# Patient Record
Sex: Male | Born: 1980 | Race: Black or African American | Hispanic: No | Marital: Married | Smoking: Current every day smoker
Health system: Southern US, Community
[De-identification: ages and names within clinical notes are randomized; demographics above are authoritative.]

---

## 2010-06-17 ENCOUNTER — Emergency Department (HOSPITAL_COMMUNITY): Admission: EM | Admit: 2010-06-17 | Discharge: 2010-06-17 | Payer: Self-pay | Admitting: Emergency Medicine

## 2010-07-13 ENCOUNTER — Emergency Department (HOSPITAL_COMMUNITY): Admission: EM | Admit: 2010-07-13 | Discharge: 2010-07-13 | Payer: Self-pay | Admitting: Emergency Medicine

## 2010-07-23 ENCOUNTER — Emergency Department (HOSPITAL_COMMUNITY): Admission: EM | Admit: 2010-07-23 | Discharge: 2010-07-23 | Payer: Self-pay | Admitting: Emergency Medicine

## 2010-10-10 ENCOUNTER — Emergency Department (HOSPITAL_COMMUNITY): Admission: EM | Admit: 2010-10-10 | Discharge: 2010-03-10 | Payer: Self-pay | Admitting: Emergency Medicine

## 2011-01-17 LAB — URINALYSIS, ROUTINE W REFLEX MICROSCOPIC
Glucose, UA: NEGATIVE mg/dL
Ketones, ur: 15 mg/dL — AB
Nitrite: NEGATIVE
Protein, ur: NEGATIVE mg/dL
Specific Gravity, Urine: 1.026 (ref 1.005–1.030)

## 2011-01-17 LAB — URINE MICROSCOPIC-ADD ON

## 2011-05-12 ENCOUNTER — Inpatient Hospital Stay (INDEPENDENT_AMBULATORY_CARE_PROVIDER_SITE_OTHER)
Admission: RE | Admit: 2011-05-12 | Discharge: 2011-05-12 | Disposition: A | Discharge status: Home / Self Care | Payer: Self-pay | Source: Ambulatory Visit | Attending: Family Medicine | Admitting: Family Medicine

## 2011-05-12 DIAGNOSIS — L259 Unspecified contact dermatitis, unspecified cause: Secondary | ICD-10-CM

## 2012-05-17 ENCOUNTER — Ambulatory Visit: Payer: Self-pay | Admitting: Physician Assistant

## 2012-05-17 DIAGNOSIS — S139XXA Sprain of joints and ligaments of unspecified parts of neck, initial encounter: Secondary | ICD-10-CM

## 2012-05-17 DIAGNOSIS — S161XXA Strain of muscle, fascia and tendon at neck level, initial encounter: Secondary | ICD-10-CM

## 2012-05-17 DIAGNOSIS — M542 Cervicalgia: Secondary | ICD-10-CM

## 2012-05-17 MED ORDER — IBUPROFEN 800 MG PO TABS: 800.0000 mg | ORAL_TABLET | Freq: Three times a day (TID) | ORAL | Status: AC | PRN

## 2012-05-17 MED ORDER — METHOCARBAMOL 500 MG PO TABS: 500.0000 mg | ORAL_TABLET | Freq: Four times a day (QID) | ORAL | Status: DC

## 2012-05-17 MED ORDER — CYCLOBENZAPRINE HCL 5 MG PO TABS: 5.0000 mg | ORAL_TABLET | Freq: Three times a day (TID) | ORAL | Status: AC | PRN

## 2012-05-17 MED ORDER — HYDROCODONE-ACETAMINOPHEN 5-325 MG PO TABS: 1.0000 | ORAL_TABLET | Freq: Every evening | ORAL | Status: DC | PRN
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
## 2012-05-19 ENCOUNTER — Telehealth: Payer: Self-pay

## 2012-05-19 NOTE — Telephone Encounter (Signed)
The motrin and norco should help with his HAs.  The HA are probably coming from the muscle spasms in his neck.

## 2012-05-19 NOTE — Telephone Encounter (Signed)
PT'S WIFE STATES THAT HE HAS BEEN HAVING HEADACHES AND WOULD LIKE TO KNOW IF HE COULD HAVE SOMETHING PRESCRIBED FOR THIS. BEST# [phone] PHARMACY:[facility] ON [address]

## 2012-05-21 ENCOUNTER — Telehealth: Payer: Self-pay | Admitting: Family Medicine

## 2012-05-21 NOTE — Telephone Encounter (Signed)
Wife CB and I D/W her message from Maralyn Sago (OK per HIPPA). She reports that he is only really having trouble w/muscle spasms at night now, but agreed that pt will RTC if his Sxs persist/worsen.

## 2012-05-21 NOTE — Telephone Encounter (Signed)
LMOM to call back

## 2012-05-24 ENCOUNTER — Ambulatory Visit: Payer: Self-pay | Admitting: Family Medicine

## 2012-05-24 DIAGNOSIS — S161XXA Strain of muscle, fascia and tendon at neck level, initial encounter: Secondary | ICD-10-CM

## 2012-05-24 DIAGNOSIS — S139XXA Sprain of joints and ligaments of unspecified parts of neck, initial encounter: Secondary | ICD-10-CM

## 2012-05-24 DIAGNOSIS — M542 Cervicalgia: Secondary | ICD-10-CM

## 2012-05-24 MED ORDER — METHOCARBAMOL 500 MG PO TABS: 500.0000 mg | ORAL_TABLET | Freq: Four times a day (QID) | ORAL | Status: AC

## 2012-05-24 MED ORDER — HYDROCODONE-ACETAMINOPHEN 5-325 MG PO TABS: 1.0000 | ORAL_TABLET | Freq: Every evening | ORAL | Status: AC | PRN
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
## 2012-06-07 ENCOUNTER — Ambulatory Visit: Payer: Self-pay | Admitting: Family Medicine

## 2012-06-07 VITALS — BP 126/88 | HR 87 | Temp 98.2°F | Resp 16 | Ht 64.25 in | Wt 156.0 lb

## 2012-06-07 DIAGNOSIS — S161XXA Strain of muscle, fascia and tendon at neck level, initial encounter: Secondary | ICD-10-CM

## 2012-06-07 DIAGNOSIS — S139XXA Sprain of joints and ligaments of unspecified parts of neck, initial encounter: Secondary | ICD-10-CM

## 2012-06-07 MED ORDER — CYCLOBENZAPRINE HCL 10 MG PO TABS: 10.0000 mg | ORAL_TABLET | Freq: Three times a day (TID) | ORAL | Status: DC | PRN
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
## 2012-07-06 ENCOUNTER — Telehealth: Payer: Self-pay

## 2012-07-06 NOTE — Telephone Encounter (Signed)
[facility] calling to see if we can fax order for this patients physical therapy pt is saying we referred him for this fax number is [phone]

## 2012-07-06 NOTE — Telephone Encounter (Signed)
To referrals to send what is needed--see old referral.

## 2013-07-09 ENCOUNTER — Encounter (HOSPITAL_COMMUNITY): Payer: Self-pay | Admitting: Emergency Medicine

## 2013-07-09 ENCOUNTER — Emergency Department (HOSPITAL_COMMUNITY)
Admission: EM | Admit: 2013-07-09 | Discharge: 2013-07-10 | Disposition: A | Discharge status: Home / Self Care | Payer: BC Managed Care – PPO | Attending: Emergency Medicine | Admitting: Emergency Medicine

## 2013-07-09 ENCOUNTER — Encounter (HOSPITAL_COMMUNITY): Payer: Self-pay | Admitting: *Deleted

## 2013-07-09 ENCOUNTER — Emergency Department (HOSPITAL_COMMUNITY)
Admission: EM | Admit: 2013-07-09 | Discharge: 2013-07-09 | Disposition: A | Discharge status: Nursing Home (Includes ICF) | Payer: BC Managed Care – PPO | Source: Home / Self Care | Attending: Family Medicine | Admitting: Family Medicine

## 2013-07-09 DIAGNOSIS — R1013 Epigastric pain: Secondary | ICD-10-CM | POA: Insufficient documentation

## 2013-07-09 DIAGNOSIS — R109 Unspecified abdominal pain: Secondary | ICD-10-CM

## 2013-07-09 DIAGNOSIS — R1011 Right upper quadrant pain: Secondary | ICD-10-CM | POA: Insufficient documentation

## 2013-07-09 DIAGNOSIS — F17200 Nicotine dependence, unspecified, uncomplicated: Secondary | ICD-10-CM | POA: Insufficient documentation

## 2013-07-09 DIAGNOSIS — K7010 Alcoholic hepatitis without ascites: Secondary | ICD-10-CM | POA: Insufficient documentation

## 2013-07-09 LAB — CBC WITH DIFFERENTIAL/PLATELET
Basophils Absolute: 0 10*3/uL (ref 0.0–0.1)
Basophils Relative: 0 % (ref 0–1)
Eosinophils Absolute: 0.1 10*3/uL (ref 0.0–0.7)
Hemoglobin: 15.7 g/dL (ref 13.0–17.0)
Lymphocytes Relative: 20 % (ref 12–46)
Lymphs Abs: 2.4 10*3/uL (ref 0.7–4.0)
MCH: 34.6 pg — ABNORMAL HIGH (ref 26.0–34.0)
MCHC: 36.3 g/dL — ABNORMAL HIGH (ref 30.0–36.0)
MCV: 95.4 fL (ref 78.0–100.0)
Monocytes Absolute: 0.7 10*3/uL (ref 0.1–1.0)
Monocytes Relative: 6 % (ref 3–12)
Platelets: 261 10*3/uL (ref 150–400)
WBC: 12.1 10*3/uL — ABNORMAL HIGH (ref 4.0–10.5)

## 2013-07-09 LAB — POCT I-STAT, CHEM 8
Calcium, Ion: 1.09 mmol/L — ABNORMAL LOW (ref 1.12–1.23)
Hemoglobin: 12.2 g/dL — ABNORMAL LOW (ref 13.0–17.0)
Potassium: 6.9 mEq/L (ref 3.5–5.1)
Sodium: 132 mEq/L — ABNORMAL LOW (ref 135–145)
TCO2: 25 mmol/L (ref 0–100)

## 2013-07-09 LAB — COMPREHENSIVE METABOLIC PANEL
ALT: 221 U/L — ABNORMAL HIGH (ref 0–53)
AST: 351 U/L — ABNORMAL HIGH (ref 0–37)
BUN: 11 mg/dL (ref 6–23)
Calcium: 9.3 mg/dL (ref 8.4–10.5)
GFR calc Af Amer: >90 mL/min (ref >90)
GFR calc non Af Amer: >90 mL/min (ref >90)
Glucose, Bld: 104 mg/dL — ABNORMAL HIGH (ref 70–99)
Sodium: 130 mEq/L — ABNORMAL LOW (ref 135–145)
Total Bilirubin: 0.7 mg/dL (ref 0.3–1.2)

## 2013-07-09 LAB — POCT URINALYSIS DIP (DEVICE)
Glucose, UA: NEGATIVE mg/dL
Hgb urine dipstick: NEGATIVE
Ketones, ur: NEGATIVE mg/dL
Leukocytes, UA: NEGATIVE
Specific Gravity, Urine: >=1.03 (ref 1.005–1.030)
pH: 5.5 (ref 5.0–8.0)

## 2013-07-09 LAB — LIPASE, BLOOD: Lipase: 87 U/L — ABNORMAL HIGH (ref 11–59)

## 2013-07-09 MED ORDER — ONDANSETRON HCL 4 MG/2ML IJ SOLN
4.0000 mg | Freq: Once | INTRAMUSCULAR | Status: AC
  Administered 2013-07-09: 4 mg via INTRAVENOUS
  Filled 2013-07-09: qty 2

## 2013-07-09 MED ORDER — SODIUM CHLORIDE 0.9 % IV BOLUS (SEPSIS)
1000.0000 mL | Freq: Once | INTRAVENOUS | Status: AC
  Administered 2013-07-09: 1000 mL via INTRAVENOUS

## 2013-07-09 MED ORDER — LIDOCAINE VISCOUS 2 % MT SOLN
15.0000 mL | Freq: Once | OROMUCOSAL | Status: AC
  Administered 2013-07-09: 15 mL via OROMUCOSAL
  Filled 2013-07-09: qty 15

## 2013-07-09 MED ORDER — MORPHINE SULFATE 4 MG/ML IJ SOLN
4.0000 mg | Freq: Once | INTRAMUSCULAR | Status: AC
  Administered 2013-07-09: 4 mg via INTRAVENOUS
  Filled 2013-07-09: qty 1

## 2013-07-09 MED ORDER — ALUM & MAG HYDROXIDE-SIMETH 200-200-20 MG/5ML PO SUSP
30.0000 mL | Freq: Once | ORAL | Status: AC
  Administered 2013-07-09: 30 mL via ORAL
  Filled 2013-07-09: qty 30

## 2013-07-09 NOTE — ED Notes (Signed)
Pt c/o abdominal pain x 7 days. Pt states stomach aches and feels bloated. Pt states he is a heavy drinker and denies heart burn. Pt also denies taking meds for relief of sxs. Jan Ranson, SMA

## 2013-07-09 NOTE — ED Provider Notes (Addendum)
complainsOf epigastric pain worse with eating for approximately one week. Pain is nonradiating. He's been treating himself with Aleve. He also admits to drinking approximately 6 beers per day. On exam he is alert nontoxic no distress. Abdomen soft minimally tender at epigastrium. No upper quadrant tenderness the right lower quadrant tenderness   Doug Sou, MD 07/09/13 2027  Doug Sou, MD 07/10/13 1610

## 2013-07-09 NOTE — ED Provider Notes (Signed)
Lee Pierce is a 32 y.o. male who presents to Urgent Care today for abdominal pain present off and on worsening over the past 6-7 days. Patient notes epigastric abdominal pain. It seemed to worse following eating a large meal. He has had pain off and on in the past. The pain seems to improve when he stopped drinking alcohol. He currently drinks about a sixpack of beer a day. Additionally he has been taking Aleve frequently for tooth pain for the last 3 months. He denies any nausea vomiting diarrhea fevers or chills. He denies any chest pain or trouble breathing   History reviewed. No pertinent past medical history. History  Substance Use Topics  . Smoking status: Current Every Day Smoker    Types: Cigarettes  . Smokeless tobacco: Not on file  . Alcohol Use: Yes   ROS as above Medications reviewed. No current facility-administered medications for this encounter.   Current Outpatient Prescriptions  Medication Sig Dispense Refill  . cyclobenzaprine (FLEXERIL) 10 MG tablet Take 1 tablet (10 mg total) by mouth 3 (three) times daily as needed.  30 tablet  0  . ibuprofen (ADVIL,MOTRIN) 800 MG tablet Take 800 mg by mouth every 8 (eight) hours as needed.      . traMADol (ULTRAM) 50 MG tablet Take 50 mg by mouth every 6 (six) hours as needed.        Exam:  BP 151/104  Pulse 85  Temp(Src) 98.6 F (37 C) (Oral)  Resp 16  SpO2 100% Gen: Well NAD HEENT: EOMI,  MMM Lungs: CTABL Nl WOB Heart: RRR no MRG Abd: NABS, nondistended. Tender palpation right upper quadrant with positive Murphy sign Exts: Non edematous BL  LE, warm and well perfused.   No results found for this or any previous visit (from the past 24 hour(s)). No results found.  Assessment and Plan: 32 y.o. male with abdominal pain. Multiple possible causes. Most likely is liver capsule versus gallstones.  Patient has a positive Murphy sign and is a heavy alcohol drinker.  Additionally he may have gastritis due to his heavy NSAID  alcohol use Additionally he may have pancreatitis. Plan to transfer to the emergency room via the shuttle for further evaluation and management Discussed warning signs or symptoms. Please see discharge instructions. Patient expresses understanding.      Rodolph Bong, MD 07/09/13 [phone]

## 2013-07-09 NOTE — ED Provider Notes (Signed)
CSN: [id number]     Arrival date & time 07/09/13  1612 History   First MD Initiated Contact with Patient 07/09/13 1839     Chief Complaint  Patient presents with  . Abdominal Pain   (Consider location/radiation/quality/duration/timing/severity/associated sxs/prior Treatment) HPI Comments: 32 year old male that is a heavy everyday drinker who presents with epigastric and right upper quadrant pain. He's had no nausea or vomiting but does endorse some early satiety. No weight gain or loss. No fevers, chills, vomiting, diarrhea, change in stool color, melena, hematochezia. He reports the pain increases slightly to about halfway through his meals. He was seen in urgent care today and referred to the emergency department for further evaluation.  Patient is a 32 y.o. male presenting with abdominal pain. The history is provided by the patient.  Abdominal Pain Pain location:  Epigastric and RUQ Pain quality: aching and fullness   Pain radiates to:  Does not radiate Pain severity:  Moderate Onset quality:  Gradual Duration:  1 week Timing:  Constant Progression:  Worsening Chronicity:  New Context: alcohol use (5-6 beers nightly)   Context: not recent illness, not recent travel, not sick contacts, not suspicious food intake and not trauma   Relieved by: Drinking alcohol. Ineffective treatments:  None tried Associated symptoms: no chest pain, no chills, no diarrhea, no dysuria, no fever, no shortness of breath, no sore throat and no vomiting   Risk factors: NSAID use (2 Aleve daily)     History reviewed. No pertinent past medical history.  History reviewed. No pertinent past surgical history.  History reviewed. No pertinent family history. History  Substance Use Topics  . Smoking status: Current Every Day Smoker    Types: Cigarettes  . Smokeless tobacco: Not on file  . Alcohol Use: Yes    Review of Systems  Constitutional: Negative for fever and chills.  HENT: Negative for sore  throat.   Respiratory: Negative for shortness of breath.   Cardiovascular: Negative for chest pain.  Gastrointestinal: Positive for abdominal pain. Negative for vomiting and diarrhea.  Genitourinary: Negative for dysuria, urgency, frequency, decreased urine volume, penile swelling, scrotal swelling, penile pain and testicular pain.  Musculoskeletal: Negative for back pain.  All other systems reviewed and are negative.    Allergies  Review of patient's allergies indicates no known allergies.  Home Medications  No current outpatient prescriptions on file. BP 134/114  Pulse 70  Temp(Src) 99.7 F (37.6 C) (Oral)  Resp 18  SpO2 100% Physical Exam  Vitals reviewed. Constitutional: He is oriented to person, place, and time. He appears well-developed and well-nourished. No distress.  HENT:  Head: Normocephalic.  Right Ear: External ear normal.  Left Ear: External ear normal.  Nose: Nose normal.  Mouth/Throat: Oropharynx is clear and moist. No oropharyngeal exudate.  Eyes: Conjunctivae and EOM are normal. Pupils are equal, round, and reactive to light.  Neck: Normal range of motion. Neck supple.  Cardiovascular: Normal rate, regular rhythm, normal heart sounds and intact distal pulses.  Exam reveals no gallop and no friction rub.   No murmur heard. Pulmonary/Chest: Effort normal and breath sounds normal.  Abdominal: Soft. Bowel sounds are normal. He exhibits no distension. There is no splenomegaly. There is tenderness (Mild) in the right upper quadrant and epigastric area. There is no rigidity, no rebound, no guarding, no CVA tenderness, no tenderness at McBurney's point and negative Murphy's sign. No hernia.  Musculoskeletal: Normal range of motion. He exhibits no edema and no tenderness.  Neurological: He is  alert and oriented to person, place, and time.  Skin: Skin is warm and dry.  Psychiatric: He has a normal mood and affect.    ED Course  Procedures (including critical care  time) Labs Review Labs Reviewed  CBC WITH DIFFERENTIAL - Abnormal; Notable for the following:    WBC 12.1 (*)    MCH 34.6 (*)    MCHC 36.3 (*)    Neutro Abs 8.9 (*)    All other components within normal limits  COMPREHENSIVE METABOLIC PANEL - Abnormal; Notable for the following:    Sodium 130 (*)    Potassium >7.5 (*)    Chloride 94 (*)    Glucose, Bld 104 (*)    Total Protein 9.0 (*)    AST 351 (*)    ALT 221 (*)    All other components within normal limits  LIPASE, BLOOD - Abnormal; Notable for the following:    Lipase 87 (*)    All other components within normal limits  POTASSIUM - Abnormal; Notable for the following:    Potassium 7.4 (*)    All other components within normal limits  POCT I-STAT, CHEM 8 - Abnormal; Notable for the following:    Sodium 132 (*)    Potassium 6.9 (*)    Calcium, Ion 1.09 (*)    Hemoglobin 12.2 (*)    HCT 36.0 (*)    All other components within normal limits   Imaging Review No results found.   Date: 07/09/2013  Rate: 90  Rhythm: normal sinus rhythm  QRS Axis: right  Intervals: normal  ST/T Wave abnormalities: nonspecific T wave changes inferiorly  Conduction Disutrbances:none  Narrative Interpretation: NSR, normal intervals, nonspecific T wave changes inferiorly, no T-wave peaking, normal QRS  Old EKG Reviewed: none available   MDM   32 year old male that is a heavy everyday drinker who presents with epigastric and right upper quadrant pain. He's had no nausea or vomiting but does endorse some early satiety. No weight gain or loss. No fevers, chills, vomiting, diarrhea, change in stool color, melena, hematochezia. He reports the pain increases slightly to about halfway through his meals. He was seen in urgent care today and referred to the emergency department for further evaluation. He is afebrile with normal vital signs. He is well appearing. He has mild tenderness to palpation of his epigastric and right upper quadrant areas. No  peritoneal signs. No CVA tenderness.  Differential diagnosis: Pancreatitis, hepatitis, gastritis, cholelithiasis, cholecystitis  Will begin his evaluation with laboratory studies and symptomatic relief.  It is not felt that imaging is indicated.   10:40 PM The patient's potassium came back elevated. There is gross hemolysis. Repeat test ordered. His other labs are consistent with alcoholic hepatitis. His lipase is mildly elevated at 87. It is felt that his abdominal discomfort is due to his alcohol abuse. His picture is likely mixed includes alcoholic hepatitis and gastritis.  12:39 AM multiple repeat attempts to obtain a reliable potassium value were unsuccessful. I discussed the issue with the laboratory staff. Therefore it given and they felt that given the appearance of his serum they would be unable to obtain a better value and that continued attempts were frivolous. There is no on-call pathologist or hematologist to discuss the case with. The on-call oncologist was contacted, but did not have any further recommendations. I discussed the issue at length with the patient and relayed to him her concerns. It is felt this is likely pseudohyperkalemia. He has no reason to be  hyperkalemic.  He has normal kidney function.  He does not take supplemental potassium. His EKG is completely normal. He reports feeling better. Repeat abdominal exam only with mild epigastric tenderness. No rebound or guarding. His vital signs have remained normal. He is counseled extensively regarding his alcohol use. He is also given information so that he may obtain a primary care doctor as an outpatient. Return precautions were reviewed.  Clinical Impression: 1. Alcoholic hepatitis   2. Abdominal pain     Disposition: Discharge  Condition: Good  I have discussed the results, Dx and Tx plan. They expressed understanding and agree with the plan and were told to return to ED with any worsening of condition or concern.     There are no discharge medications for this patient.   Follow Up: [facility]     [address] [place] [address]      Pt seen in conjunction with Dr. Ethelda Chick.  Reine Just. Beverely Pace, MD Emergency Medicine PGY-III [phone]   Oleh Genin, MD 07/10/13 [phone]

## 2013-07-09 NOTE — ED Notes (Signed)
Pt in c/o abd pain over the last 6-7 days, sent down from urgent care for further evaluation, denies vomiting or fever

## 2013-07-10 NOTE — ED Provider Notes (Signed)
I have personally seen and examined the patient.  I have discussed the plan of care with the resident.  I have reviewed the documentation on PMH/FH/Soc. History.  I have reviewed the documentation of the resident and agree.  Doug Sou, MD 07/10/13 [phone]

## 2013-09-22 ENCOUNTER — Encounter (HOSPITAL_COMMUNITY): Payer: Self-pay | Admitting: Emergency Medicine

## 2013-09-22 ENCOUNTER — Emergency Department (INDEPENDENT_AMBULATORY_CARE_PROVIDER_SITE_OTHER)
Admission: EM | Admit: 2013-09-22 | Discharge: 2013-09-22 | Disposition: A | Discharge status: Home / Self Care | Payer: BC Managed Care – PPO | Source: Home / Self Care | Attending: Family Medicine | Admitting: Family Medicine

## 2013-09-22 DIAGNOSIS — L0291 Cutaneous abscess, unspecified: Secondary | ICD-10-CM

## 2013-09-22 MED ORDER — DOXYCYCLINE HYCLATE 100 MG PO CAPS: 100.0000 mg | ORAL_CAPSULE | Freq: Two times a day (BID) | ORAL | Status: DC

## 2013-09-22 NOTE — ED Provider Notes (Signed)
Lee Pierce is a 32 y.o. male who presents to Urgent Care today for perineal abscess present for the last 3 days. He notes pain with walking and sitting. He notes mild pus discharge. He denies any fevers chills nausea vomiting or diarrhea. He denies any history of similar abscess. He has not tried any medications.    History reviewed. No pertinent past medical history. History  Substance Use Topics  . Smoking status: Current Every Day Smoker -- 0.50 packs/day    Types: Cigarettes  . Smokeless tobacco: Not on file  . Alcohol Use: Yes   ROS as above Medications reviewed. No current facility-administered medications for this encounter.   Current Outpatient Prescriptions  Medication Sig Dispense Refill  . doxycycline (VIBRAMYCIN) 100 MG capsule Take 1 capsule (100 mg total) by mouth 2 (two) times daily.  20 capsule  0    Exam:  BP 143/104  Pulse 83  Temp(Src) 98.1 F (36.7 C) (Oral)  Resp 18  SpO2 100% Gen: Well NAD Skin: Large area of induration) ill area with fluctuance and near the rectum.  Tender to touch.  Surrounding erythema present.   INCISION AND DRAINAGE Performed by: Clementeen Graham, S Consent: Verbal consent obtained. Risks and benefits: risks, benefits and alternatives were discussed Type: abscess  Body area: Perineum right  Anesthesia: local infiltration  Incision was made with a scalpel.  Local anesthetic: lidocaine 2 % with epinephrine  Anesthetic total: 3 ml  Complexity: Simple Blunt dissection to break up loculations  Drainage: purulent  Drainage amount: 5 mL   Packing material: 1/2 in iodoform gauze. Approximately 3 inches   Patient tolerance: Patient tolerated the procedure well with no immediate complications.   Assessment and Plan: 32 y.o. male with . He'll abscess status post incision and drainage.  Culture obtained.  Place patient on empiric doxycycline antibiotics.  Remove packing material in 3 days Work note until Monday.  Followup  as needed.  Discussed warning signs or symptoms. Please see discharge instructions. Patient expresses understanding.    Rodolph Bong, MD 09/22/13 [phone]

## 2013-09-22 NOTE — ED Notes (Signed)
Pt c/o abscess on right glut onset Sunday Reports its getting bigger and it's painful At one point it started to drain a little bit Hurts to walk... Rubbing against inner thight Denies: f/v/n/d Alert w/no signs of acute distress... Slow gait

## 2013-09-26 ENCOUNTER — Telehealth (HOSPITAL_COMMUNITY): Payer: Self-pay | Admitting: *Deleted

## 2013-09-26 LAB — CULTURE, ROUTINE-ABSCESS

## 2013-09-26 NOTE — ED Notes (Signed)
Abscess culture R perirectal area: Abundant MRSA.  Pt. adequately treated with Doxycycline. I called pt. Pt. verified x 2 and given results.  Pt. told he was adequately treated and to finish all of the antibiotics. I reviewed the [facility] MRSA instructions with the pt.  He voiced understanding. Vassie Moselle 09/26/2013

## 2014-02-04 ENCOUNTER — Encounter (HOSPITAL_COMMUNITY): Payer: Self-pay | Admitting: Emergency Medicine

## 2014-02-04 ENCOUNTER — Emergency Department (HOSPITAL_COMMUNITY)
Admission: EM | Admit: 2014-02-04 | Discharge: 2014-02-04 | Disposition: A | Discharge status: Home / Self Care | Payer: BC Managed Care – PPO | Attending: Emergency Medicine | Admitting: Emergency Medicine

## 2014-02-04 DIAGNOSIS — S41111A Laceration without foreign body of right upper arm, initial encounter: Secondary | ICD-10-CM

## 2014-02-04 DIAGNOSIS — F17200 Nicotine dependence, unspecified, uncomplicated: Secondary | ICD-10-CM | POA: Insufficient documentation

## 2014-02-04 DIAGNOSIS — Y9389 Activity, other specified: Secondary | ICD-10-CM | POA: Insufficient documentation

## 2014-02-04 DIAGNOSIS — S61409A Unspecified open wound of unspecified hand, initial encounter: Secondary | ICD-10-CM | POA: Insufficient documentation

## 2014-02-04 DIAGNOSIS — W268XXA Contact with other sharp object(s), not elsewhere classified, initial encounter: Secondary | ICD-10-CM | POA: Insufficient documentation

## 2014-02-04 DIAGNOSIS — Y9289 Other specified places as the place of occurrence of the external cause: Secondary | ICD-10-CM | POA: Insufficient documentation

## 2014-02-04 DIAGNOSIS — S41109A Unspecified open wound of unspecified upper arm, initial encounter: Secondary | ICD-10-CM | POA: Insufficient documentation

## 2014-02-04 DIAGNOSIS — Z23 Encounter for immunization: Secondary | ICD-10-CM | POA: Insufficient documentation

## 2014-02-04 MED ORDER — TETANUS-DIPHTH-ACELL PERTUSSIS 5-2.5-18.5 LF-MCG/0.5 IM SUSP
0.5000 mL | Freq: Once | INTRAMUSCULAR | Status: AC
  Administered 2014-02-04: 0.5 mL via INTRAMUSCULAR
  Filled 2014-02-04: qty 0.5

## 2014-02-04 MED ORDER — TETANUS-DIPHTH-ACELL PERTUSSIS 5-2.5-18.5 LF-MCG/0.5 IM SUSP
INTRAMUSCULAR | Status: DC
Start: 2014-02-04 — End: 2014-02-05
  Filled 2014-02-04: qty 0.5

## 2014-02-04 NOTE — ED Provider Notes (Signed)
CSN: [id number]     Arrival date & time 02/04/14  2113 History  This chart was scribed for non-physician practitioner Elpidio AnisShari Torri Langston, PA-C working with Shanna CiscoMegan E Docherty, MD by Dorothey Basemania Sutton, ED Scribe. This patient was seen in room TR08C/TR08C and the patient's care was started at 10:23 PM.    Chief Complaint  Patient presents with  . Laceration   The history is provided by the patient. No language interpreter was used.   HPI Comments: Lee Pierce is a 33 y.o. male who presents to the Emergency Department with [employer] complaining of multiple, small, superficial lacerations and abrasions with mild surrounding ecchymosis and erythema to the right upper arm and right hand that he sustained earlier today by punching through a glass door. The bleeding is well-controlled at this time. Patient is complaining of constant, mild pain to the area that is exacerbated with touch/applied pressure. Patient does not remember when his last tetanus vaccination was. Patient has no allergies to medications. Patient has no other pertinent medical history.   History reviewed. No pertinent past medical history. History reviewed. No pertinent past surgical history. No family history on file. History  Substance Use Topics  . Smoking status: Current Every Day Smoker -- 0.50 packs/day    Types: Cigarettes  . Smokeless tobacco: Not on file  . Alcohol Use: Yes    Review of Systems  Musculoskeletal: Positive for myalgias.  Skin: Positive for color change (ecchymosis, erythema) and wound (laceration, abrasion).  All other systems reviewed and are negative.   Allergies  Review of patient's allergies indicates no known allergies.  Home Medications  No current outpatient prescriptions on file.  Triage Vitals: BP 133/57  Pulse 118  Temp(Src) 99 F (37.2 C) (Oral)  Resp 18  SpO2 97%  Physical Exam  Nursing note and vitals reviewed. Constitutional: He is oriented to person, place, and time. He appears well-developed  and well-nourished. No distress.  HENT:  Head: Normocephalic and atraumatic.  Eyes: Conjunctivae are normal.  Neck: Normal range of motion. Neck supple.  Pulmonary/Chest: Effort normal. No respiratory distress.  Abdominal: He exhibits no distension.  Musculoskeletal: Normal range of motion.  Neurological: He is alert and oriented to person, place, and time.  Skin: Skin is warm and dry.  Multiple, superficial, small lacerations without obvious foreign bodies to right upper arm over the medial and interior aspects.   Psychiatric: He has a normal mood and affect. His behavior is normal.    ED Course  Procedures (including critical care time)  DIAGNOSTIC STUDIES: Oxygen Saturation is 97% on room air, normal by my interpretation.    COORDINATION OF CARE: 10:32 PM- Performed wound care. Will order a tetanus vaccination. Discussed treatment plan with patient at bedside and patient verbalized agreement.   The wound is cleansed, debrided of foreign material as much as possible, and dressed. The patient is alerted to watch for any signs of infection (redness, pus, pain, increased swelling or fever) and call if such occurs. Home wound care instructions are provided. Tetanus vaccination status reviewed: ordered  Wound care: multiple small wounds to right upper arm explored with irrigation, forceps, palpation. No FB's present to examination.   Labs Review Labs Reviewed - No data to display Imaging Review No results found.   EKG Interpretation None      MDM   Final diagnoses:  None    1. Lacerations right arm  Tetanus updated. Wounds cleaned, explored, dressed with topical abx. Stable for discharge into custody of [employer].  I personally performed the services described in this documentation, which was scribed in my presence. The recorded information has been reviewed and is accurate.      Arnoldo Hooker, PA-C 02/04/14 2249

## 2014-02-04 NOTE — ED Notes (Signed)
All small lacerations cleaned by PA.  Bacitracin and dressing applied.

## 2014-02-04 NOTE — ED Notes (Signed)
The pt is here with the [facility].  He was cut by glass to the rt upper arm and  Rt hand .  He went through a glass door.  Bleeding controlled

## 2014-02-04 NOTE — Discharge Instructions (Signed)
Tetanus and Diphtheria Vaccine Your caregiver has suggested that you receive an immunization to prevent tetanus (lockjaw) and diphtheria. Tetanus and diphtheria are serious and deadly infectious diseases of the past that have been nearly wiped out by modern immunizations. Td or DT vaccines (shots) are the immunizations given to help prevent these illnesses. Td is the medical term for a standard tetanus dose, small diphtheria dose. DT means both in standard doses. ABOUT THE DISEASES Tetanus (lockjaw) and diphtheria are serious diseases. Tetanus is caused by a germ that lives in the soil. It enters the body through a cut or wound, often caused by a nail or broken piece of glass. You cannot catch tetanus from another person. Diphtheria spreads when germs pass from an infected person to the nose or throat of others. Tetanus causes serious, painful spasms of all muscles. It can lead to:  "Locking" of the muscles of the jaw and throat, so the patient cannot open his or her mouth or swallow.  Damage to the heart muscle. Diphtheria causes a thick coating in the nose, throat, or airway. It can lead to:  Breathing problems.  Kidney problems.  Heart failure.  Paralysis.  Death. ABOUT THE VACCINES  A vaccine is a shot (immunization) that can help prevent a disease. Vaccines have helped lower the rates of getting certain diseases. If people stopped getting vaccinated, more people would develop illnesses. These vaccines can be used in three ways:  As catch-up for people who did not get all their doses when they were children.  As a booster dose every 10 years.  For protection against tetanus infection, after a wound. Benefits of the vaccines Vaccination is the best way to protect against tetanus and diphtheria. Because of vaccination, there are fewer cases of these diseases. Cases are rare in children because most get a routine vaccination with DTP (Diphtheria, Tetanus, and Pertussis), DTaP  (Diphtheria, Tetanus, and acellular Pertussis), or DT (Diphtheria and Tetanus) vaccines. There would be many more cases if we stopped vaccinating people. Tetanus kills about 1 in 5 people who are infected. WHEN SHOULD YOU GET TD VACCINE?  Td is made for people 62 years of age and older.  People who have not gotten at least 3 doses of any tetanus and diphtheria vaccine (DTP, DTaP or DT) during their lifetime should do so using Td. After a person gets the third dose, a Td dose is needed every 10 years all through life. This is because protection fades over time. Booster shots are needed every 10 years.  Other vaccines may be given at the same time as Td. You may not know today whether your immunizations are current. The vaccine given today is to protect you from your next cut or injury. It does not offer protection for the current injury. An immune globulin injection may be given, if protection is needed immediately. Check with your caregiver later regarding your immunization status. Tell your caregiver if the person getting the vaccine:  Has ever had a serious allergic reaction or other problem with Td, or any other tetanus and diphtheria vaccine (DTP, DTaP, or DT). People who have had a serious allergic reaction should not receive the vaccine.  Has epilepsy or another nervous system illness.  Has had Guillain Barre Syndrome (GBS) in the past.  Now has a moderate or severe illness.  Is pregnant.  If you are not sure, ask your caregiver. WHAT ARE THE RISKS FROM TD VACCINE?  As with any medicine, there are very small  risks that serious problems, even death, could occur after getting a vaccine. However, the risk of a serious side effect from the vaccine is almost zero.  The risks from the vaccine are much smaller than the risks from the diseases, if people stopped getting vaccinated. Both diseases can cause serious health problems, which are prevented by the vaccine.  Almost all people who get  Td have no problems from it. Mild problems If mild problems occur, they usually start within hours to a day or two after vaccination. They may last 1-2 days:  Soreness, redness, or swelling where the shot was given.  Headache or tiredness.  Occasionally, a low grade fever. These problems can be worse in adults who get Td vaccine very often. Non-aspirin medicines may be used to reduce soreness. Severe problems These problems happen very rarely:  Serious allergic reaction (at most, occurs in 1 in 1 million vaccinated persons). This occurs almost immediately, and is treatable with medicines. Signs of a serious allergic reaction include:  Difficulty breathing.  Hoarseness or wheezing.  Hives.  Dizziness.  Deep, aching pain and muscle wasting in upper arm(s). Overall, the benefits to you and your family from these vaccines are far greater than the risk. WHAT TO DO IF THERE IS A SERIOUS REACTION:  Call a caregiver or get the person to a doctor or emergency room right away.  Write down what happened, the date and time it happened, and tell your caregiver.  Ask your caregiver to file a Vaccine Adverse Event Report form or call, toll-free: [phone](800) [phone] If you want to learn more about this vaccine, ask your caregiver. She/he can give you the vaccine package insert or suggest other sources of information. Also, the [employer] Injury Compensation Program gives compensation (payment) for persons thought to be injured by vaccines. For details call, toll-free: [phone](800) (367)813-7526. Document Released: 10/17/2000 Document Revised: 01/12/2012 Document Reviewed: 09/06/2009 [facility] Patient Information 2014 [place], [place]. Wound Care Wound care helps prevent pain and infection.  You may need a tetanus shot if:  You cannot remember when you had your last tetanus shot.  You have never had a tetanus shot.  The injury broke your skin. If you need a tetanus shot and you choose not to have one,  you may get tetanus. Sickness from tetanus can be serious. HOME CARE   Only take medicine as told by your doctor.  Clean the wound daily with mild soap and water.  Change any bandages (dressings) as told by your doctor.  Put medicated cream and a bandage on the wound as told by your doctor.  Change the bandage if it gets wet, dirty, or starts to smell.  Take showers. Do not take baths, swim, or do anything that puts your wound under water.  Rest and raise (elevate) the wound until the pain and puffiness (swelling) are better.  Keep all doctor visits as told. GET HELP RIGHT AWAY IF:   Yellowish-white fluid (pus) comes from the wound.  Medicine does not lessen your pain.  There is a red streak going away from the wound.  You have a fever. MAKE SURE YOU:   Understand these instructions.  Will watch your condition.  Will get help right away if you are not doing well or get worse. Document Released: 07/29/2008 Document Revised: 01/12/2012 Document Reviewed: 02/23/2011 [facility] Patient Information 2014 [place], [place].

## 2014-02-05 NOTE — ED Provider Notes (Signed)
Medical screening examination/treatment/procedure(s) were performed by non-physician practitioner and as supervising physician I was immediately available for consultation/collaboration.   Megan E Docherty, MD 02/05/14 1054 

## 2014-06-26 ENCOUNTER — Ambulatory Visit (INDEPENDENT_AMBULATORY_CARE_PROVIDER_SITE_OTHER): Payer: BC Managed Care – PPO | Admitting: Medical

## 2014-06-26 ENCOUNTER — Encounter: Payer: Self-pay | Admitting: Medical

## 2014-06-26 VITALS — BP 132/80 | HR 88 | Temp 98.1°F | Resp 16 | Ht 64.2 in | Wt 160.0 lb

## 2014-06-26 DIAGNOSIS — M67472 Ganglion, left ankle and foot: Secondary | ICD-10-CM

## 2014-06-26 DIAGNOSIS — M6740 Ganglion, unspecified site: Secondary | ICD-10-CM

## 2014-06-26 DIAGNOSIS — F17200 Nicotine dependence, unspecified, uncomplicated: Secondary | ICD-10-CM

## 2014-06-26 DIAGNOSIS — B353 Tinea pedis: Secondary | ICD-10-CM

## 2014-06-26 MED ORDER — TERBINAFINE HCL 1 % EX CREA: 1.0000 "application " | TOPICAL_CREAM | Freq: Two times a day (BID) | CUTANEOUS | Status: DC

## 2014-06-26 NOTE — Progress Notes (Signed)
   Subjective:   Lee Pierce is a 33 y.o. male presenting on 06/26/2014 with KNOT ON THE TOP OF HIS LEFT FOOT and ESTABLISH AS A NEW PATIENT  Here as a new patient, accompanied by a wife and 47-year-old daughter.  His main concern is a knot on the top of his left foot has been there for about a year. It went away one time and came back. It is not bothering him he just wants it checked out. It is not growing or changing  He has no other concerns  Review of Systems ROS as in subjective      Objective:     Filed Vitals:   06/26/14 1430  BP: 132/80  Pulse: 88  Temp: 98.1 F (36.7 C)  Resp: 16    General appearance: alert, no distress, WD/WN Left dorsal foot with 3 cm multinodular lesion that is mobile and nontender consistent with ganglion cyst. Leg and foot otherwise neurovascularly intact MSK normal toe and foot range of motion Skin: There is your irritation and maceration between toes on both feet consistent with tinea     Assessment: Encounter Diagnoses  Name Primary?  . Ganglion cyst of left foot Yes  . Tobacco use disorder   . Tinea pedis of both feet      Plan: Ganglion cyst - we discussed the exam findings, and at this point since it is not bothering him we will leave alone.  At any point going forward if needed, he can call and we will refer to Gen. Surgery  Tobacco  use-discussed the dangers of smoking, advise he consider stopping  Tinea pedis-discussed the findings, diagnosis, treatment, begin Lamisil cream  Dickson was seen today for knot on the top of his left foot and establish as a new patient.  Diagnoses and associated orders for this visit:  Ganglion cyst of left foot  Tobacco use disorder  Tinea pedis of both feet  Other Orders - terbinafine (LAMISIL AT) 1 % cream; Apply 1 application topically 2 (two) times daily.     Return soon for CPX.

## 2014-06-26 NOTE — Patient Instructions (Signed)
YOU CAN QUIT SMOKING!  Talk to your medical provider about using medicines to help you quit. These include nicotine replacement gum, lozenges, or skin patches.  Consider calling 1-800-QUIT-NOW, a toll free 24/7 hotline with free counseling to help you quit.  If you are ready to quit smoking or are thinking about it, congratulations! You have chosen to help yourself be healthier and live longer! There are lots of different ways to quit smoking. Nicotine gum, nicotine patches, a nicotine inhaler, or nicotine nasal spray can help with physical craving. Hypnosis, support groups, and medicines help break the habit of smoking. TIPS TO GET OFF AND STAY OFF CIGARETTES  Learn to predict your moods. Do not let a bad situation be your excuse to have a cigarette. Some situations in your life might tempt you to have a cigarette.   Ask friends and co-workers not to smoke around you.   Make your home smoke-free.   Never have "just one" cigarette. It leads to wanting another and another. Remind yourself of your decision to quit.   On a card, make a list of your reasons for not smoking. Read it at least the same number of times a day as you have a cigarette. Tell yourself everyday, "I do not want to smoke. I choose not to smoke."   Ask someone at home or work to help you with your plan to quit smoking.   Have something planned after you eat or have a cup of coffee. Take a walk or get other exercise to perk you up. This will help to keep you from overeating.   Try a relaxation exercise to calm you down and decrease your stress. Remember, you may be tense and nervous the first two weeks after you quit. This will pass.   Find new activities to keep your hands busy. Play with a pen, coin, or rubber band. Doodle or draw things on paper.   Brush your teeth right after eating. This will help cut down the craving for the taste of tobacco after meals. You can try mouthwash too.   Try gum, breath mints, or diet  candy to keep something in your mouth.  IF YOU SMOKE AND WANT TO QUIT:  Do not stock up on cigarettes. Never buy a carton. Wait until one pack is finished before you buy another.   Never carry cigarettes with you at work or at home.   Keep cigarettes as far away from you as possible. Leave them with someone else.   Never carry matches or a lighter with you.   Ask yourself, "Do I need this cigarette or is this just a reflex?"   Bet with someone that you can quit. Put cigarette money in a piggy bank every morning. If you smoke, you give up the money. If you do not smoke, by the end of the week, you keep the money.   Keep trying. It takes 21 days to change a habit!  Document Released: 08/16/2009 Document Revised: 07/02/2011 Document Reviewed: 08/16/2009 [facility] Patient Information 2012 [place], [place].   Ganglion Cyst A ganglion cyst is a noncancerous, fluid-filled lump that occurs near joints or tendons. The ganglion cyst grows out of a joint or the lining of a tendon. It most often develops in the hand or wrist but can also develop in the shoulder, elbow, hip, knee, ankle, or foot. The round or oval ganglion can be pea sized or larger than a grape. Increased activity may enlarge the size of the cyst  because more fluid starts to build up.  CAUSES  It is not completely known what causes a ganglion cyst to grow. However, it may be related to:  Inflammation or irritation around the joint.  An injury.  Repetitive movements or overuse.  Arthritis. SYMPTOMS  A lump most often appears in the hand or wrist, but can occur in other areas of the body. Generally, the lump is painless without other symptoms. However, sometimes pain can be felt during activity or when pressure is applied to the lump. The lump may even be tender to the touch. Tingling, pain, numbness, or muscle weakness can occur if the ganglion cyst presses on a nerve. Your grip may be weak and you may have less movement in your  joints.  DIAGNOSIS  Ganglion cysts are most often diagnosed based on a physical exam, noting where the cyst is and how it looks. Your caregiver will feel the lump and may shine a light alongside it. If it is a ganglion, a light often shines through it. Your caregiver may order an X-ray, ultrasound, or MRI to rule out other conditions. TREATMENT  Ganglions usually go away on their own without treatment. If pain or other symptoms are involved, treatment may be needed. Treatment is also needed if the ganglion limits your movement or if it gets infected. Treatment options include:  Wearing a wrist or finger brace or splint.  Taking anti-inflammatory medicine.  Draining fluid from the lump with a needle (aspiration).  Injecting a steroid into the joint.  Surgery to remove the ganglion cyst and its stalk that is attached to the joint or tendon. However, ganglion cysts can grow back. HOME CARE INSTRUCTIONS   Do not press on the ganglion, poke it with a needle, or hit it with a heavy object. You may rub the lump gently and often. Sometimes fluid moves out of the cyst.  Only take medicines as directed by your caregiver.  Wear your brace or splint as directed by your caregiver. SEEK MEDICAL CARE IF:   Your ganglion becomes larger or more painful.  You have increased redness, red streaks, or swelling.  You have pus coming from the lump.  You have weakness or numbness in the affected area. MAKE SURE YOU:   Understand these instructions.  Will watch your condition.  Will get help right away if you are not doing well or get worse. Document Released: 10/17/2000 Document Revised: 07/14/2012 Document Reviewed: 12/14/2007 [facility] Patient Information 2015 [place], [place]. This information is not intended to replace advice given to you by your health care provider. Make sure you discuss any questions you have with your health care provider.    Athlete's Foot Athlete's foot (tinea pedis) is a  fungal infection of the skin on the feet. It often occurs on the skin between the toes or underneath the toes. It can also occur on the soles of the feet. Athlete's foot is more likely to occur in hot, humid weather. Not washing your feet or changing your socks often enough can contribute to athlete's foot. The infection can spread from person to person (contagious). CAUSES Athlete's foot is caused by a fungus. This fungus thrives in warm, moist places. Most people get athlete's foot by sharing shower stalls, towels, and wet floors with an infected person. People with weakened immune systems, including those with diabetes, may be more likely to get athlete's foot. SYMPTOMS   Itchy areas between the toes or on the soles of the feet.  White, flaky,  or scaly areas between the toes or on the soles of the feet.  Tiny, intensely itchy blisters between the toes or on the soles of the feet.  Tiny cuts on the skin. These cuts can develop a bacterial infection.  Thick or discolored toenails. DIAGNOSIS  Your caregiver can usually tell what the problem is by doing a physical exam. Your caregiver may also take a skin sample from the rash area. The skin sample may be examined under a microscope, or it may be tested to see if fungus will grow in the sample. A sample may also be taken from your toenail for testing. TREATMENT  Over-the-counter and prescription medicines can be used to kill the fungus. These medicines are available as powders or creams. Your caregiver can suggest medicines for you. Fungal infections respond slowly to treatment. You may need to continue using your medicine for several weeks. PREVENTION   Do not share towels.  Wear sandals in wet areas, such as shared locker rooms and shared showers.  Keep your feet dry. Wear shoes that allow air to circulate. Wear cotton or wool socks. HOME CARE INSTRUCTIONS   Take medicines as directed by your caregiver. Do not use steroid creams on  athlete's foot.  Keep your feet clean and cool. Wash your feet daily and dry them thoroughly, especially between your toes.  Change your socks every day. Wear cotton or wool socks. In hot climates, you may need to change your socks 2 to 3 times per day.  Wear sandals or canvas tennis shoes with good air circulation.  If you have blisters, soak your feet in Burow's solution or Epsom salts for 20 to 30 minutes, 2 times a day to dry out the blisters. Make sure you dry your feet thoroughly afterward. SEEK MEDICAL CARE IF:   You have a fever.  You have swelling, soreness, warmth, or redness in your foot.  You are not getting better after 7 days of treatment.  You are not completely cured after 30 days.  You have any problems caused by your medicines. MAKE SURE YOU:   Understand these instructions.  Will watch your condition.  Will get help right away if you are not doing well or get worse. Document Released: 10/17/2000 Document Revised: 01/12/2012 Document Reviewed: 08/08/2011 [facility] Patient Information 2015 [place], [place]. This information is not intended to replace advice given to you by your health care provider. Make sure you discuss any questions you have with your health care provider.

## 2014-08-07 ENCOUNTER — Emergency Department (HOSPITAL_COMMUNITY)
Admission: EM | Admit: 2014-08-07 | Discharge: 2014-08-07 | Disposition: A | Discharge status: Home / Self Care | Payer: BC Managed Care – PPO | Attending: Emergency Medicine | Admitting: Emergency Medicine

## 2014-08-07 ENCOUNTER — Emergency Department (HOSPITAL_COMMUNITY): Payer: BC Managed Care – PPO

## 2014-08-07 ENCOUNTER — Encounter (HOSPITAL_COMMUNITY): Payer: Self-pay | Admitting: Emergency Medicine

## 2014-08-07 DIAGNOSIS — Z79899 Other long term (current) drug therapy: Secondary | ICD-10-CM | POA: Insufficient documentation

## 2014-08-07 DIAGNOSIS — M546 Pain in thoracic spine: Secondary | ICD-10-CM

## 2014-08-07 DIAGNOSIS — M549 Dorsalgia, unspecified: Secondary | ICD-10-CM | POA: Diagnosis present

## 2014-08-07 DIAGNOSIS — Z720 Tobacco use: Secondary | ICD-10-CM | POA: Diagnosis not present

## 2014-08-07 MED ORDER — CYCLOBENZAPRINE HCL 10 MG PO TABS: 10.0000 mg | ORAL_TABLET | Freq: Two times a day (BID) | ORAL | Status: DC | PRN

## 2014-08-07 MED ORDER — IBUPROFEN 600 MG PO TABS: 600.0000 mg | ORAL_TABLET | Freq: Four times a day (QID) | ORAL | Status: DC | PRN

## 2014-08-07 NOTE — ED Notes (Signed)
Pt states he works in [employer] doing a lot of heavy lifting

## 2014-08-07 NOTE — Discharge Instructions (Signed)
Back Pain, Adult Low back pain is very common. About 1 in 5 people have back pain.The cause of low back pain is rarely dangerous. The pain often gets better over time.About half of people with a sudden onset of back pain feel better in just 2 weeks. About 8 in 10 people feel better by 6 weeks.  CAUSES Some common causes of back pain include:  Strain of the muscles or ligaments supporting the spine.  Wear and tear (degeneration) of the spinal discs.  Arthritis.  Direct injury to the back. DIAGNOSIS Most of the time, the direct cause of low back pain is not known.However, back pain can be treated effectively even when the exact cause of the pain is unknown.Answering your caregiver's questions about your overall health and symptoms is one of the most accurate ways to make sure the cause of your pain is not dangerous. If your caregiver needs more information, he or she may order lab work or imaging tests (X-rays or MRIs).However, even if imaging tests show changes in your back, this usually does not require surgery. HOME CARE INSTRUCTIONS For many people, back pain returns.Since low back pain is rarely dangerous, it is often a condition that people can learn to manageon their own.   Remain active. It is stressful on the back to sit or stand in one place. Do not sit, drive, or stand in one place for more than 30 minutes at a time. Take short walks on level surfaces as soon as pain allows.Try to increase the length of time you walk each day.  Do not stay in bed.Resting more than 1 or 2 days can delay your recovery.  Do not avoid exercise or work.Your body is made to move.It is not dangerous to be active, even though your back may hurt.Your back will likely heal faster if you return to being active before your pain is gone.  Pay attention to your body when you bend and lift. Many people have less discomfortwhen lifting if they bend their knees, keep the load close to their bodies,and  avoid twisting. Often, the most comfortable positions are those that put less stress on your recovering back.  Find a comfortable position to sleep. Use a firm mattress and lie on your side with your knees slightly bent. If you lie on your back, put a pillow under your knees.  Only take over-the-counter or prescription medicines as directed by your caregiver. Over-the-counter medicines to reduce pain and inflammation are often the most helpful.Your caregiver may prescribe muscle relaxant drugs.These medicines help dull your pain so you can more quickly return to your normal activities and healthy exercise.  Put ice on the injured area.  Put ice in a plastic bag.  Place a towel between your skin and the bag.  Leave the ice on for 15-20 minutes, 03-04 times a day for the first 2 to 3 days. After that, ice and heat may be alternated to reduce pain and spasms.  Ask your caregiver about trying back exercises and gentle massage. This may be of some benefit.  Avoid feeling anxious or stressed.Stress increases muscle tension and can worsen back pain.It is important to recognize when you are anxious or stressed and learn ways to manage it.Exercise is a great option. SEEK MEDICAL CARE IF:  You have pain that is not relieved with rest or medicine.  You have pain that does not improve in 1 week.  You have new symptoms.  You are generally not feeling well. SEEK   IMMEDIATE MEDICAL CARE IF:   You have pain that radiates from your back into your legs.  You develop new bowel or bladder control problems.  You have unusual weakness or numbness in your arms or legs.  You develop nausea or vomiting.  You develop abdominal pain.  You feel faint. Document Released: 10/20/2005 Document Revised: 04/20/2012 Document Reviewed: 02/21/2014 [employer] Patient Information 2015 [employer]. This information is not intended to replace advice given to you by your health care provider. Make sure you  discuss any questions you have with your health care provider.  
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
## 2014-08-07 NOTE — ED Notes (Signed)
Declined W/C at D/C and was escorted to lobby by RN. 
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
## 2014-08-08 NOTE — ED Provider Notes (Signed)
Medical screening examination/treatment/procedure(s) were performed by non-physician practitioner and as supervising physician I was immediately available for consultation/collaboration.   EKG Interpretation None        David H Yao, MD 08/08/14 2232 

## 2014-08-10 ENCOUNTER — Emergency Department (HOSPITAL_COMMUNITY)
Admission: EM | Admit: 2014-08-10 | Discharge: 2014-08-11 | Disposition: A | Discharge status: Home / Self Care | Payer: BC Managed Care – PPO | Attending: Emergency Medicine | Admitting: Emergency Medicine

## 2014-08-10 ENCOUNTER — Encounter (HOSPITAL_COMMUNITY): Payer: Self-pay | Admitting: Emergency Medicine

## 2014-08-10 DIAGNOSIS — M79672 Pain in left foot: Secondary | ICD-10-CM | POA: Diagnosis present

## 2014-08-10 DIAGNOSIS — M67472 Ganglion, left ankle and foot: Secondary | ICD-10-CM | POA: Diagnosis not present

## 2014-08-10 DIAGNOSIS — M67479 Ganglion, unspecified ankle and foot: Secondary | ICD-10-CM

## 2014-08-10 DIAGNOSIS — Z720 Tobacco use: Secondary | ICD-10-CM | POA: Insufficient documentation

## 2014-08-10 NOTE — ED Notes (Signed)
Pt. reports progressing pain /swelling at left foot and increasing size of cyst at anterior left foot for the past several weeks . Denies injury/ambulatory.

## 2014-08-11 ENCOUNTER — Emergency Department (HOSPITAL_COMMUNITY): Payer: BC Managed Care – PPO

## 2014-08-11 NOTE — ED Provider Notes (Signed)
CSN: [id number]     Arrival date & time 08/10/14  2325 History   First MD Initiated Contact with Patient 08/11/14 0000     Chief Complaint  Patient presents with  . Foot Pain     (Consider location/radiation/quality/duration/timing/severity/associated sxs/prior Treatment) The history is provided by the patient. No language interpreter was used.  Lee Pierce is a 33 y/o M with no known significant PMHx presenting to the ED with left foot pain secondary to a cyst-like lesion that has been ongoing for the past year. Patient reported that when he first noticed the lesion one year ago he stated that the lesions was small. Patient then reported that as time has progressed the lesion has increased in size. Stated that there has been a mild throbbing pain - worse with wearing shoes. Stated that he was seen and assessed by his PCP regarding this not too long ago who reported that the cyst is mainly a ganglion cyst - did not recommend any treatment at the time. Denied drainage, bleeding, hot to the touch, numbness, tingling, loss of sensation, fever, chills. PCP Dr. Aleen Campiysinger  History reviewed. No pertinent past medical history. History reviewed. No pertinent past surgical history. No family history on file. History  Substance Use Topics  . Smoking status: Current Every Day Smoker -- 0.50 packs/day    Types: Cigarettes  . Smokeless tobacco: Not on file  . Alcohol Use: Yes    Review of Systems  Constitutional: Negative for fever and chills.  Musculoskeletal: Positive for arthralgias (left foot pain ). Negative for neck pain.  Skin: Negative for color change and wound.       Cyst to dorsal aspect of the left foot  Neurological: Negative for weakness and numbness.      Allergies  Review of patient's allergies indicates no known allergies.  Home Medications   Prior to Admission medications   Medication Sig Start Date End Date Taking? Authorizing Provider  cyclobenzaprine (FLEXERIL) 10 MG  tablet Take 1 tablet (10 mg total) by mouth 2 (two) times daily as needed for muscle spasms. 08/07/14   Monte FantasiaJoseph W Mintz, PA-C  ibuprofen (ADVIL,MOTRIN) 600 MG tablet Take 1 tablet (600 mg total) by mouth every 6 (six) hours as needed. 08/07/14   Monte FantasiaJoseph W Mintz, PA-C   BP 115/78  Pulse 96  Temp(Src) 98.3 F (36.8 C) (Oral)  Resp 18  SpO2 99% Physical Exam  Nursing note and vitals reviewed. Constitutional: He is oriented to person, place, and time. He appears well-developed and well-nourished. No distress.  HENT:  Head: Normocephalic and atraumatic.  Eyes: Conjunctivae and EOM are normal. Right eye exhibits no discharge. Left eye exhibits no discharge.  Neck: Normal range of motion. Neck supple.  Cardiovascular: Normal rate, regular rhythm and normal heart sounds.   Pulses:      Radial pulses are 2+ on the right side, and 2+ on the left side.       Dorsalis pedis pulses are 2+ on the right side, and 2+ on the left side.  Pulmonary/Chest: Effort normal and breath sounds normal. No respiratory distress. He has no wheezes. He has no rales.  Musculoskeletal: Normal range of motion.       Feet:  Full range of motion to the left ankle and digits of the left foot without difficulty or ataxia.  Approximately 2.5 cm x 2.5 cm cyst like lesion localized to the dorsal aspect of the foot with negative erythema, inflammation, sores, divets, bleeding, drainage. Very minimal  discomfort upon palpation. Cystlike lesion is mobile, soft upon palpation. Negative induration. Doubt abscess.  Neurological: He is alert and oriented to person, place, and time. No cranial nerve deficit. He exhibits normal muscle tone. Coordination normal.  Cranial nerves III-XII grossly intact Strength 5+/5+ to lower extremities bilaterally with resistance applied, equal distribution noted Strength intact to digits of the feet bilaterally Sensation intact with differentiation to sharp and dull touch 2 point discrimination localized  to the digits of the left foot Gait proper, proper balance - negative sway, negative drift, negative step-offs  Skin: Skin is warm and dry. No rash noted. He is not diaphoretic. No erythema.  Psychiatric: He has a normal mood and affect. His behavior is normal. Thought content normal.    ED Course  Procedures (including critical care time) Labs Review Labs Reviewed - No data to display  Imaging Review Dg Foot Complete Left  08/11/2014   CLINICAL DATA:  Swelling over the dorsal aspect of the foot.  EXAM: LEFT FOOT - COMPLETE 3+ VIEW  COMPARISON:  None.  FINDINGS: Focal soft tissue swelling is present of the level of the TMT joints. There is no focal osseous abnormality. No radiopaque foreign body is evident. The bones the foot are within normal limits.  IMPRESSION: Soft tissue swelling over the dorsum of the foot compatible with focal cellulitis or abscess. No discrete osseous abnormality is evident.   Electronically Signed   By: Gennette Pachris  Mattern M.D.   On: 08/11/2014 01:55     EKG Interpretation None      MDM   Final diagnoses:  Ganglion cyst of foot    Medications - No data to display  Filed Vitals:   08/10/14 2339 08/11/14 0223  BP: 117/70 115/78  Pulse: 102 96  Temp: 98.3 F (36.8 C)   TempSrc: Oral   Resp: 20 18  SpO2: 97% 99%   This provider reviewed the patient's chart. Patient was seen and assessed by his PCP on 06/26/2014 regarding similar discomfort where patient was diagnosed with ganglion cyst - as per physician, reported that if symptoms were to continue patient would be referred to General Surgery.  Cyst like lesion localized to the dorsal aspect of the left foot measuring approximately 2.5 cm x 2.5 cm-soft and mobile. eNegative erythema, warmth upon palpation. Negative drainage. Patient reported that this is the lesion has been present for approximately one year. Plain film of left foot noted soft tissue swelling over the dorsum of the foot compatible with focal  cellulitis or abscess-no discrete osseous abnormality noted. Doubt epidural abscess/cyst. Doubt abscess - negative erythema, warmth upon palpation, lesion is mobile and soft, very minimal discomfort upon palpation, as well as this lesion has been present for one year. Doubt infectious process. Discussed case with attending physician, Dr. Jenean Lindau. Otter who agrees this is not an abscess - agreed to discharge and for patient to follow-up as outpatient. Suspicion to be possible ganglion cyst versus possible lipoma. Patient stable, afebrile. Patient not septic appearing. Discharged patient. Discussed with patient to rest. Referred to PCP and General Surgery. Discussed with patient to closely monitor symptoms and if symptoms are to worsen or change to report back to the ED - strict return instructions given.  Patient agreed to plan of care, understood, all questions answered.   Raymon MuttonMarissa Eon Zunker, PA-C 08/11/14 1554

## 2014-08-11 NOTE — ED Provider Notes (Signed)
Medical screening examination/treatment/procedure(s) were performed by non-physician practitioner and as supervising physician I was immediately available for consultation/collaboration.   EKG Interpretation None       Olivia Mackielga M Jaquayla Hege, MD 08/11/14 Barry [id number]

## 2014-08-11 NOTE — Discharge Instructions (Signed)
Please call your doctor for a followup appointment within 24-48 hours. When you talk to your doctor please let them know that you were seen in the emergency department and have them acquire all of your records so that they can discuss the findings with you and formulate a treatment plan to fully care for your new and ongoing problems. Please call and set-up an appointment with your primary care provider, surgery, and foot physician Please rest and stay hydrated Please keep foot elevated Please continue to monitor symptoms closely and if symptoms are to worsen or change (fever greater than 101, chills, sweating, nausea, vomiting, chest pain, shortness of breathe, difficulty breathing, weakness, numbness, tingling, worsening or changes to pain pattern, swelling to the foot, redness, red streaks, hot to the touch, drainage) please report back to the Emergency Department immediately.   Ganglion Cyst A ganglion cyst is a noncancerous, fluid-filled lump that occurs near joints or tendons. The ganglion cyst grows out of a joint or the lining of a tendon. It most often develops in the hand or wrist but can also develop in the shoulder, elbow, hip, knee, ankle, or foot. The round or oval ganglion can be pea sized or larger than a grape. Increased activity may enlarge the size of the cyst because more fluid starts to build up.  CAUSES  It is not completely known what causes a ganglion cyst to grow. However, it may be related to:  Inflammation or irritation around the joint.  An injury.  Repetitive movements or overuse.  Arthritis. SYMPTOMS  A lump most often appears in the hand or wrist, but can occur in other areas of the body. Generally, the lump is painless without other symptoms. However, sometimes pain can be felt during activity or when pressure is applied to the lump. The lump may even be tender to the touch. Tingling, pain, numbness, or muscle weakness can occur if the ganglion cyst presses on a  nerve. Your grip may be weak and you may have less movement in your joints.  DIAGNOSIS  Ganglion cysts are most often diagnosed based on a physical exam, noting where the cyst is and how it looks. Your caregiver will feel the lump and may shine a light alongside it. If it is a ganglion, a light often shines through it. Your caregiver may order an X-ray, ultrasound, or MRI to rule out other conditions. TREATMENT  Ganglions usually go away on their own without treatment. If pain or other symptoms are involved, treatment may be needed. Treatment is also needed if the ganglion limits your movement or if it gets infected. Treatment options include:  Wearing a wrist or finger brace or splint.  Taking anti-inflammatory medicine.  Draining fluid from the lump with a needle (aspiration).  Injecting a steroid into the joint.  Surgery to remove the ganglion cyst and its stalk that is attached to the joint or tendon. However, ganglion cysts can grow back. HOME CARE INSTRUCTIONS   Do not press on the ganglion, poke it with a needle, or hit it with a heavy object. You may rub the lump gently and often. Sometimes fluid moves out of the cyst.  Only take medicines as directed by your caregiver.  Wear your brace or splint as directed by your caregiver. SEEK MEDICAL CARE IF:   Your ganglion becomes larger or more painful.  You have increased redness, red streaks, or swelling.  You have pus coming from the lump.  You have weakness or numbness in the  affected area. MAKE SURE YOU:   Understand these instructions.  Will watch your condition.  Will get help right away if you are not doing well or get worse. Document Released: 10/17/2000 Document Revised: 07/14/2012 Document Reviewed: 12/14/2007 [facility] Patient Information 2015 [employer] [place]. This information is not intended to replace advice given to you by your health care provider. Make sure you discuss any questions you have with your health  care provider.

## 2014-08-11 NOTE — ED Notes (Signed)
Pt A&Ox4, ambulatory at d/c with steady gait, NAD 

## 2014-08-14 ENCOUNTER — Ambulatory Visit (INDEPENDENT_AMBULATORY_CARE_PROVIDER_SITE_OTHER): Payer: BC Managed Care – PPO | Admitting: Medical

## 2014-08-14 ENCOUNTER — Telehealth: Payer: Self-pay | Admitting: Medical

## 2014-08-14 ENCOUNTER — Other Ambulatory Visit: Payer: Self-pay | Admitting: Family Medicine

## 2014-08-14 ENCOUNTER — Encounter: Payer: Self-pay | Admitting: Medical

## 2014-08-14 VITALS — BP 122/80 | HR 82 | Temp 98.4°F | Resp 16 | Wt 158.0 lb

## 2014-08-14 DIAGNOSIS — M6740 Ganglion, unspecified site: Secondary | ICD-10-CM

## 2014-08-14 DIAGNOSIS — M546 Pain in thoracic spine: Secondary | ICD-10-CM

## 2014-08-14 NOTE — Progress Notes (Signed)
   Subjective:   Lee Pierce is a 33 y.o. male presenting on 08/14/2014 with cyst on left foot is larger  Here for ED f/u.   [facility]    The first visit was regarding thoracic back pain.   He does manual labor on the job, lifts heavy objects often.  Gets intermittent flare ups of mid to upper back pain.   Recently had upper back flare up, went to the ED, was prescribed naprosyn and flexeril, but he didn't take the flexeril, has used the naprosyn, and symptoms have resolved.  Does get intermittent flare ups from time to time.   Denies specific injury, fall, trauma, fever, weakness.  He does get occasional tingling in arms, mostly forearms and hands.  No constant tingling or numbness.  No swelling of UE.    His most recent ED visit was due to left foot knot/cyst that seemed to get biggger and more painful without redness, drainage, fever.  He can't tolerate the pain when wearing his steel toe work boots.   Review of Systems ROS as in subjective      Objective:     Filed Vitals:   08/14/14 1424  BP: 122/80  Pulse: 82  Temp: 98.4 F (36.9 C)  Resp: 16    General appearance: alert, no distress, WD/WN Left dorsal foot with 3 cm multinodular lesion that is mobile and nontender consistent with ganglion cyst. Leg and foot otherwise neurovascularly intact MSK normal toe and foot range of motion Back: nontender, normal ROM, no scoliosis Skin: no foot erythema, fluctuance, warmth      Assessment: Encounter Diagnoses  Name Primary?  . Ganglion cyst Yes  . Bilateral thoracic back pain      Plan: Reviewed both recent ED visit notes, xrays of chest, including thoracic spine on the CXR, and foot xrays.    Ganglion cyst - refer to surgeon for excision.  Reiterated for him not to go to the ED for non emergency issues.     Thoracic back pain - discussed back strengthening exercises, daily stretching routine, and when flared up/worse pain use the  Naprosyn, and QHS can use Flexeril prn.  Advised to contact us for worse back pains.    Lee Pierce was seen today for cyst on left foot is larger.  Diagnoses and associated orders for this visit:  Ganglion cyst  Bilateral thoracic back pain     Return referral.

## 2014-08-14 NOTE — Telephone Encounter (Signed)
pls refer to either podiatry or general surgery.  He has a ganglion cyst like lesion on the dorsal left foot, which causes him pain at times, has gotten bigger.  He wants it excised ASAP.  If podiatry, make sure they do this type of procedure.  I assume they would.

## 2014-08-14 NOTE — Telephone Encounter (Signed)
Patient is aware of his appointment to see Dr. Ralene CorkSikora on 08/29/14 @ 200 pm [facility] [phone]

## 2014-08-16 ENCOUNTER — Other Ambulatory Visit: Payer: Self-pay | Admitting: Family Medicine

## 2014-08-16 DIAGNOSIS — M6740 Ganglion, unspecified site: Secondary | ICD-10-CM

## 2014-08-29 ENCOUNTER — Ambulatory Visit: Payer: BC Managed Care – PPO

## 2015-08-28 IMAGING — CR DG CHEST 2V
2 series · 2 of 2 positions shown · non-contrast
Comparison: None.

CLINICAL DATA: Inspiratory back pain; history of tobacco use ;
initial visit

EXAM:
CHEST  2 VIEW

[w chest pa]
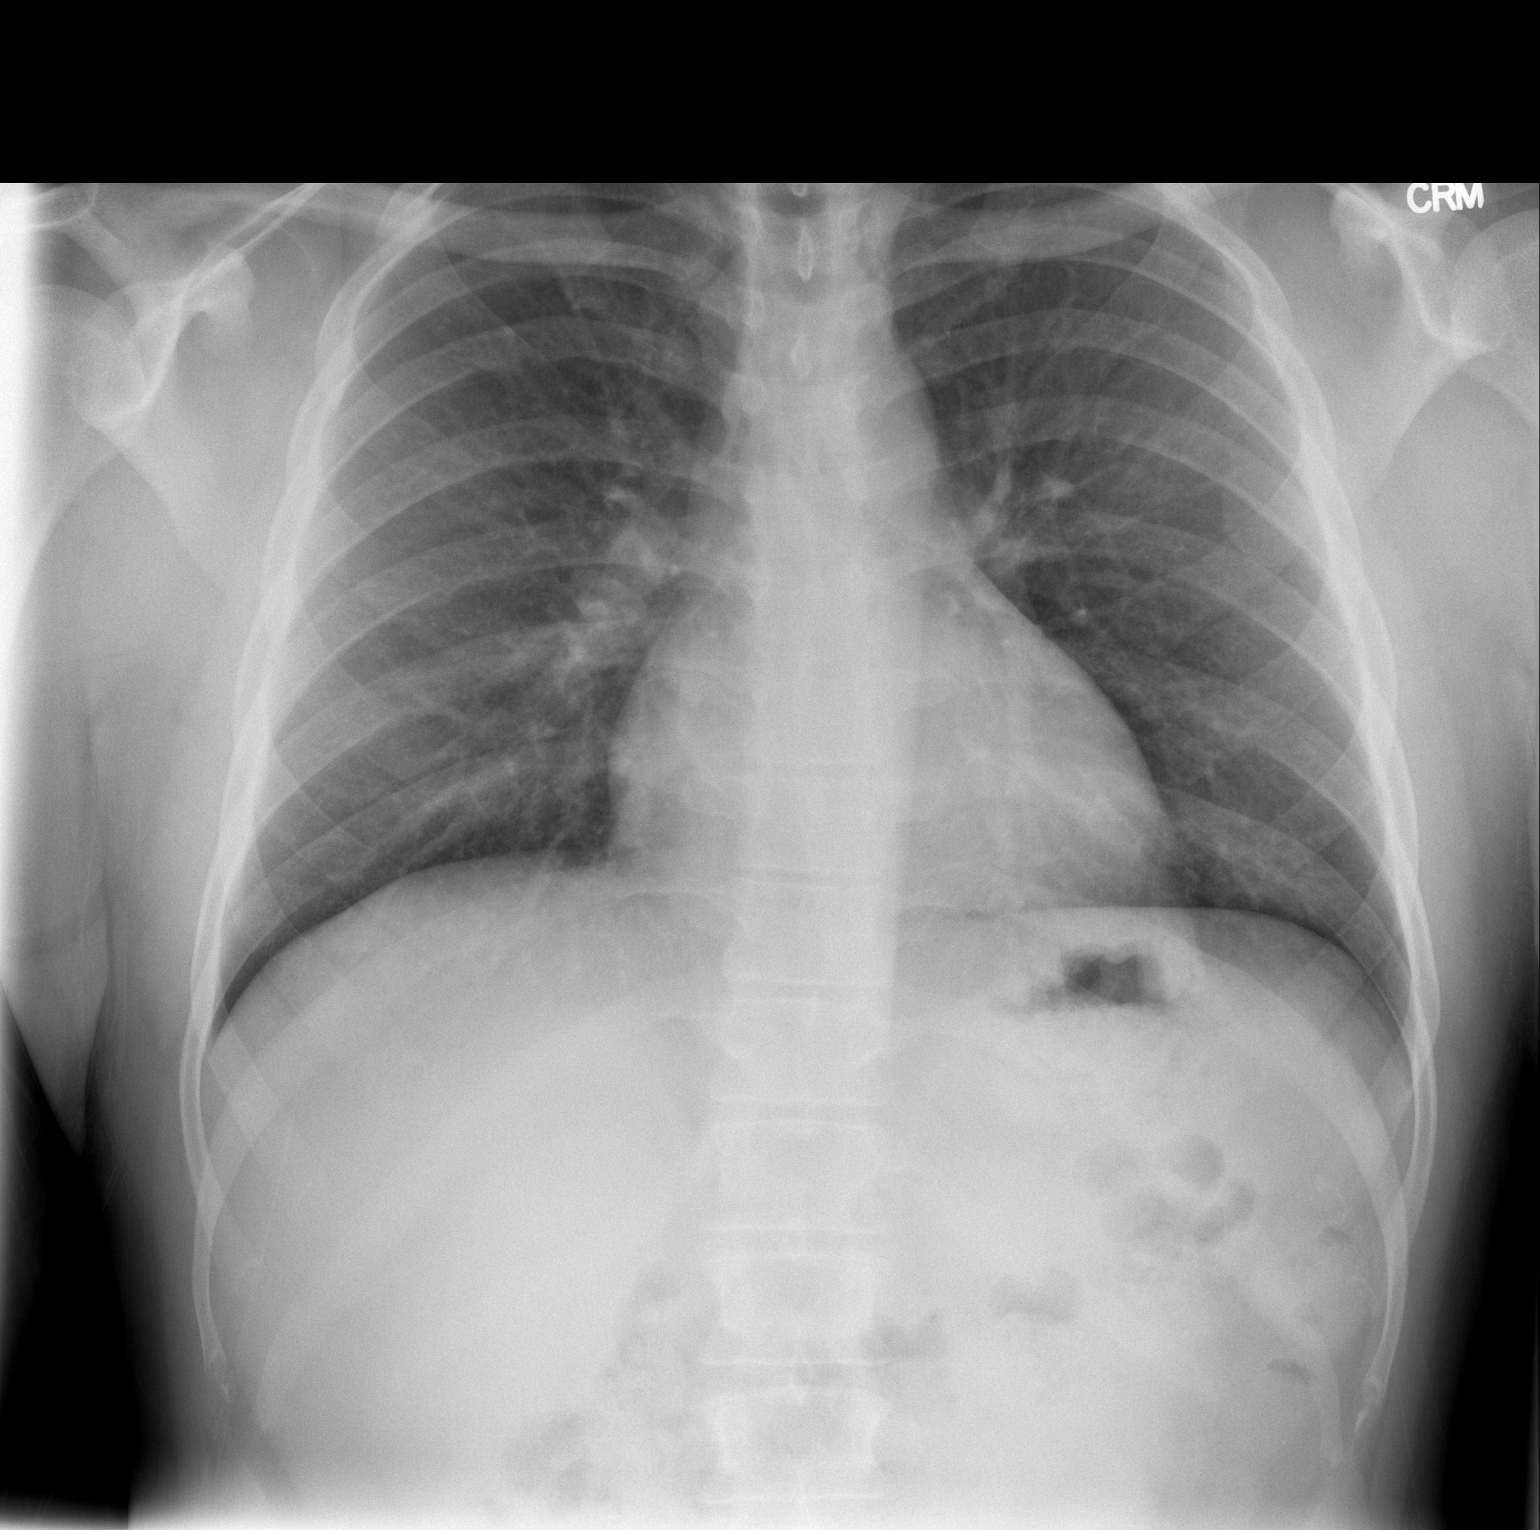

[w chest lat]
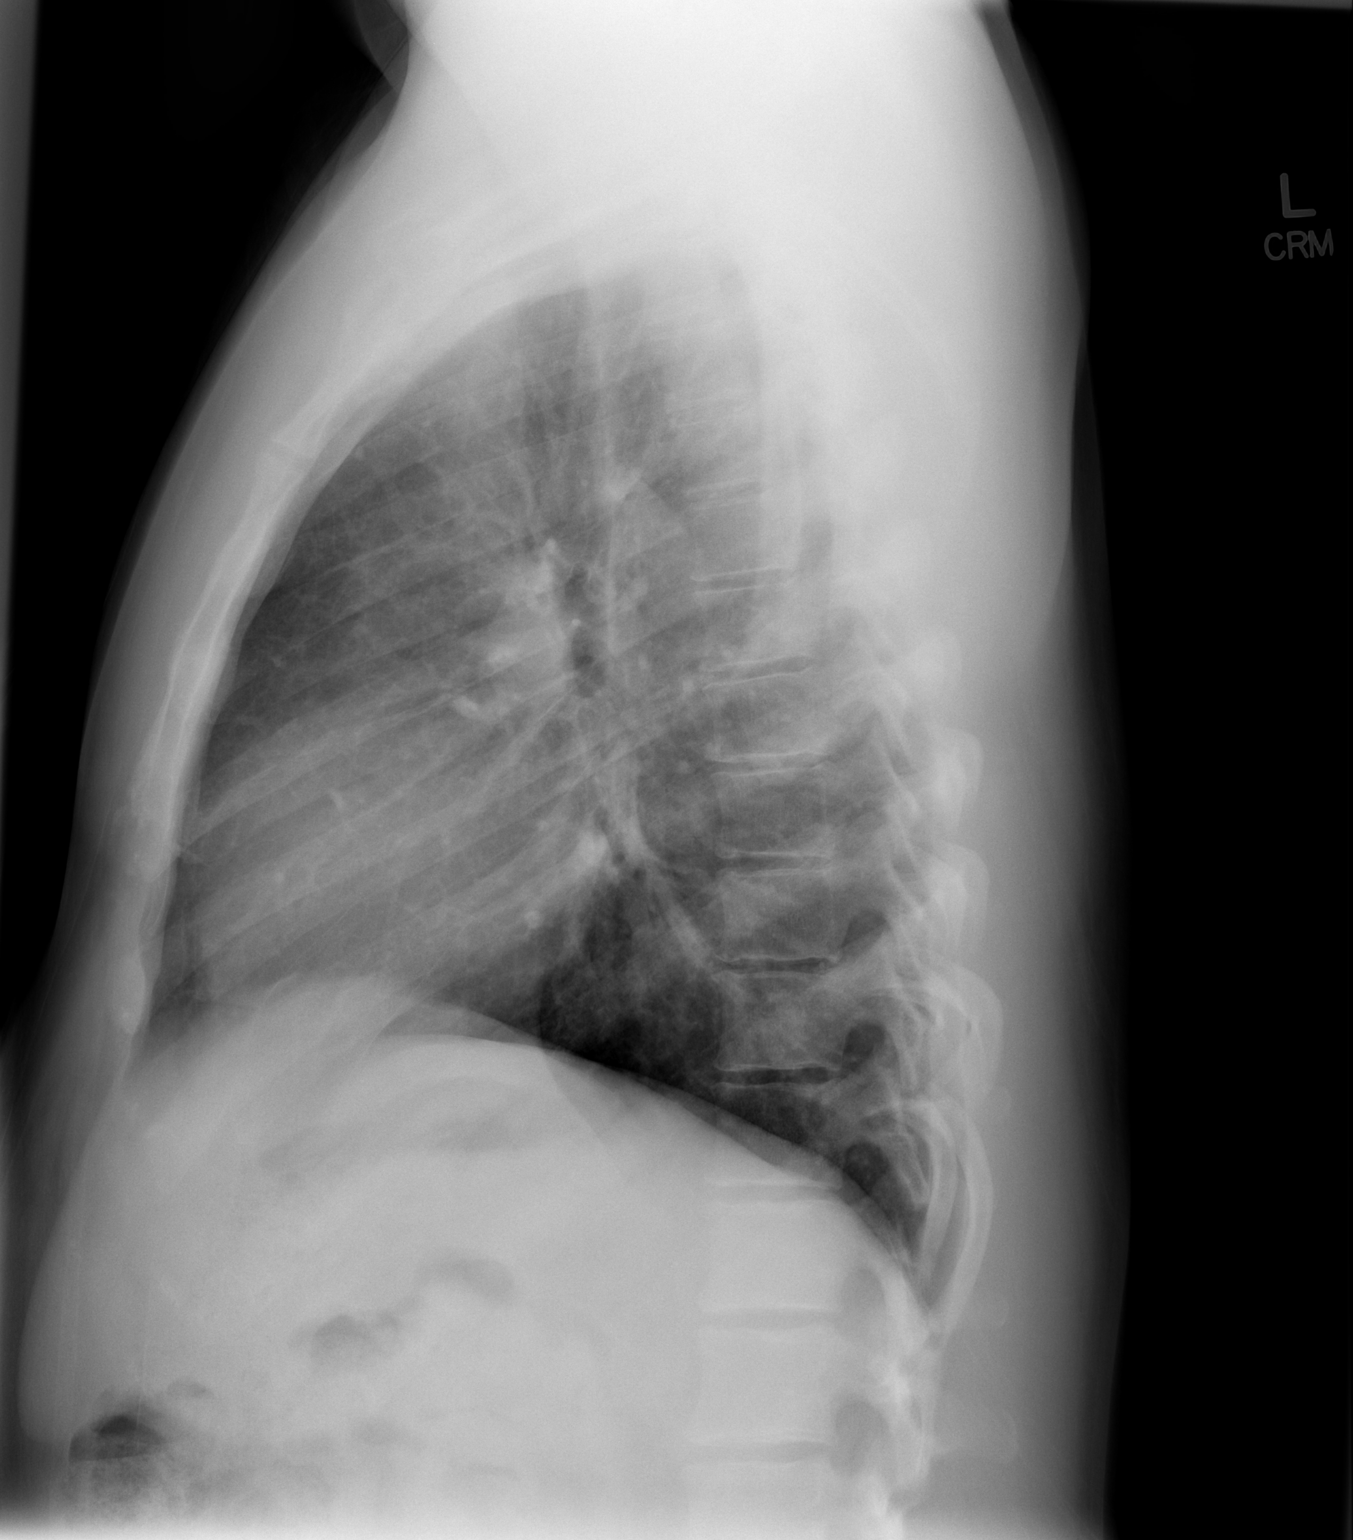

[2 of 2 positions shown; findings below may reference images not displayed]

FINDINGS: The lungs are adequately inflated. There is no focal infiltrate. The
interstitial markings are mildly prominent likely reflecting the
patient's tobacco use. The heart and pulmonary vascularity are
within the limits of normal. The mediastinum is normal in width.
There is no pleural effusion or pneumothorax. The bony thorax is
unremarkable.
IMPRESSION: There is no definite acute cardiopulmonary abnormality. One cannot
exclude acute bronchitis in the appropriate clinical setting.

## 2015-09-01 IMAGING — CR DG FOOT COMPLETE 3+V*L*
3 series · 3 of 3 positions shown · non-contrast
Comparison: None.

CLINICAL DATA: Swelling over the dorsal aspect of the foot.

EXAM:
LEFT FOOT - COMPLETE 3+ VIEW

[x foot ap left]
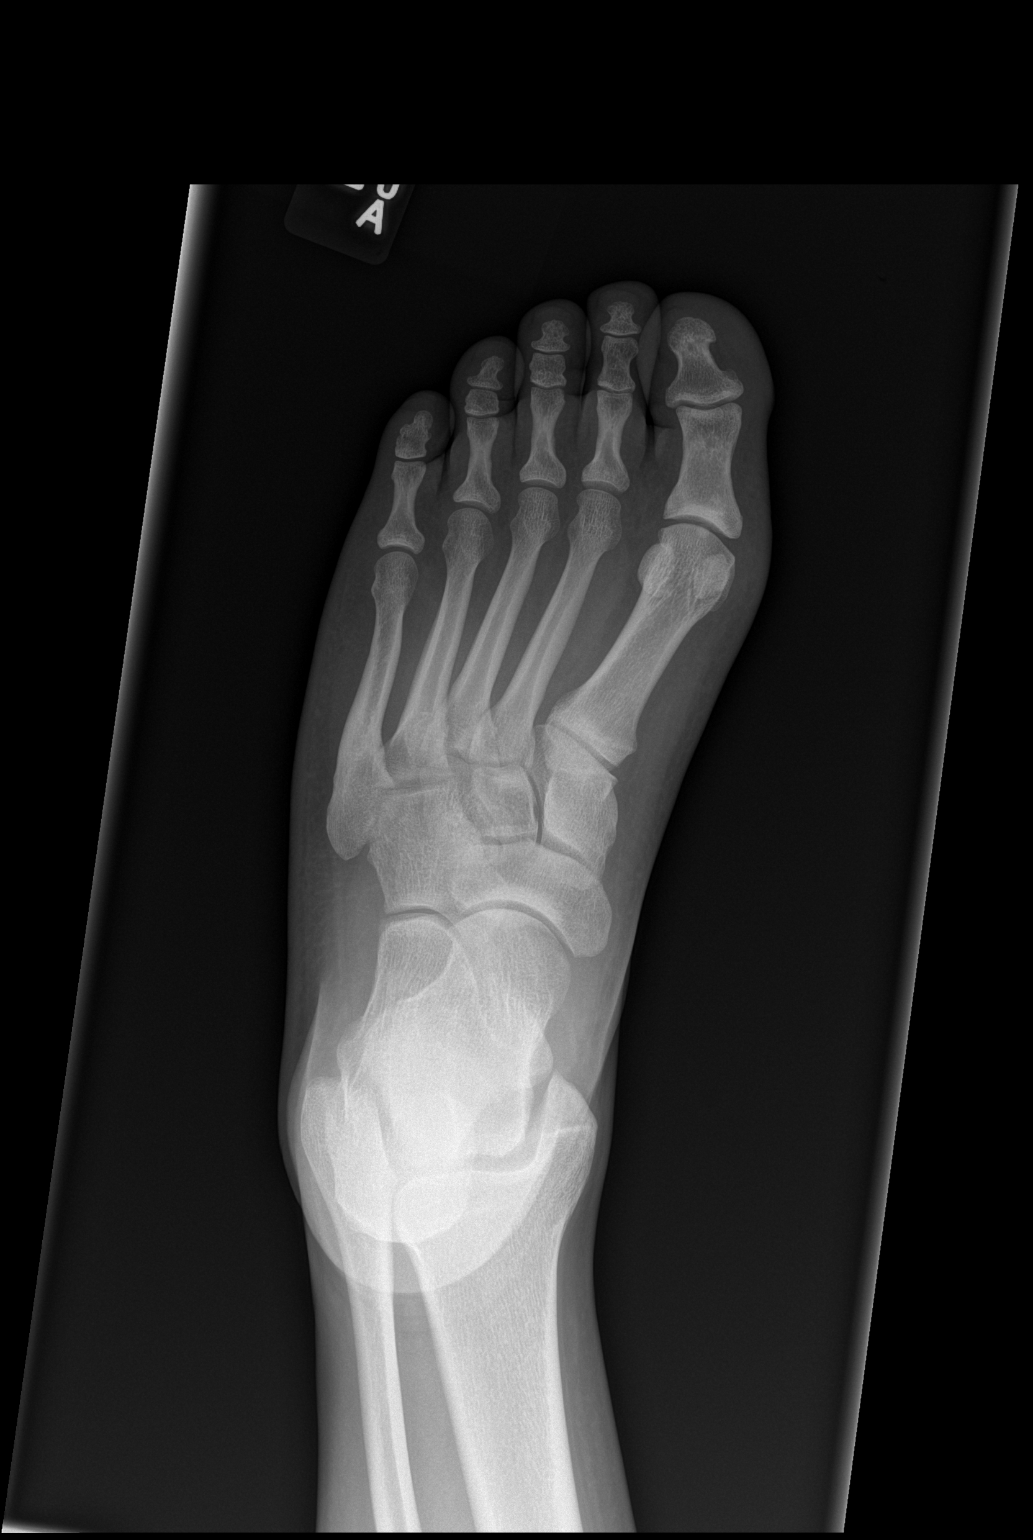

[x foot obl left]
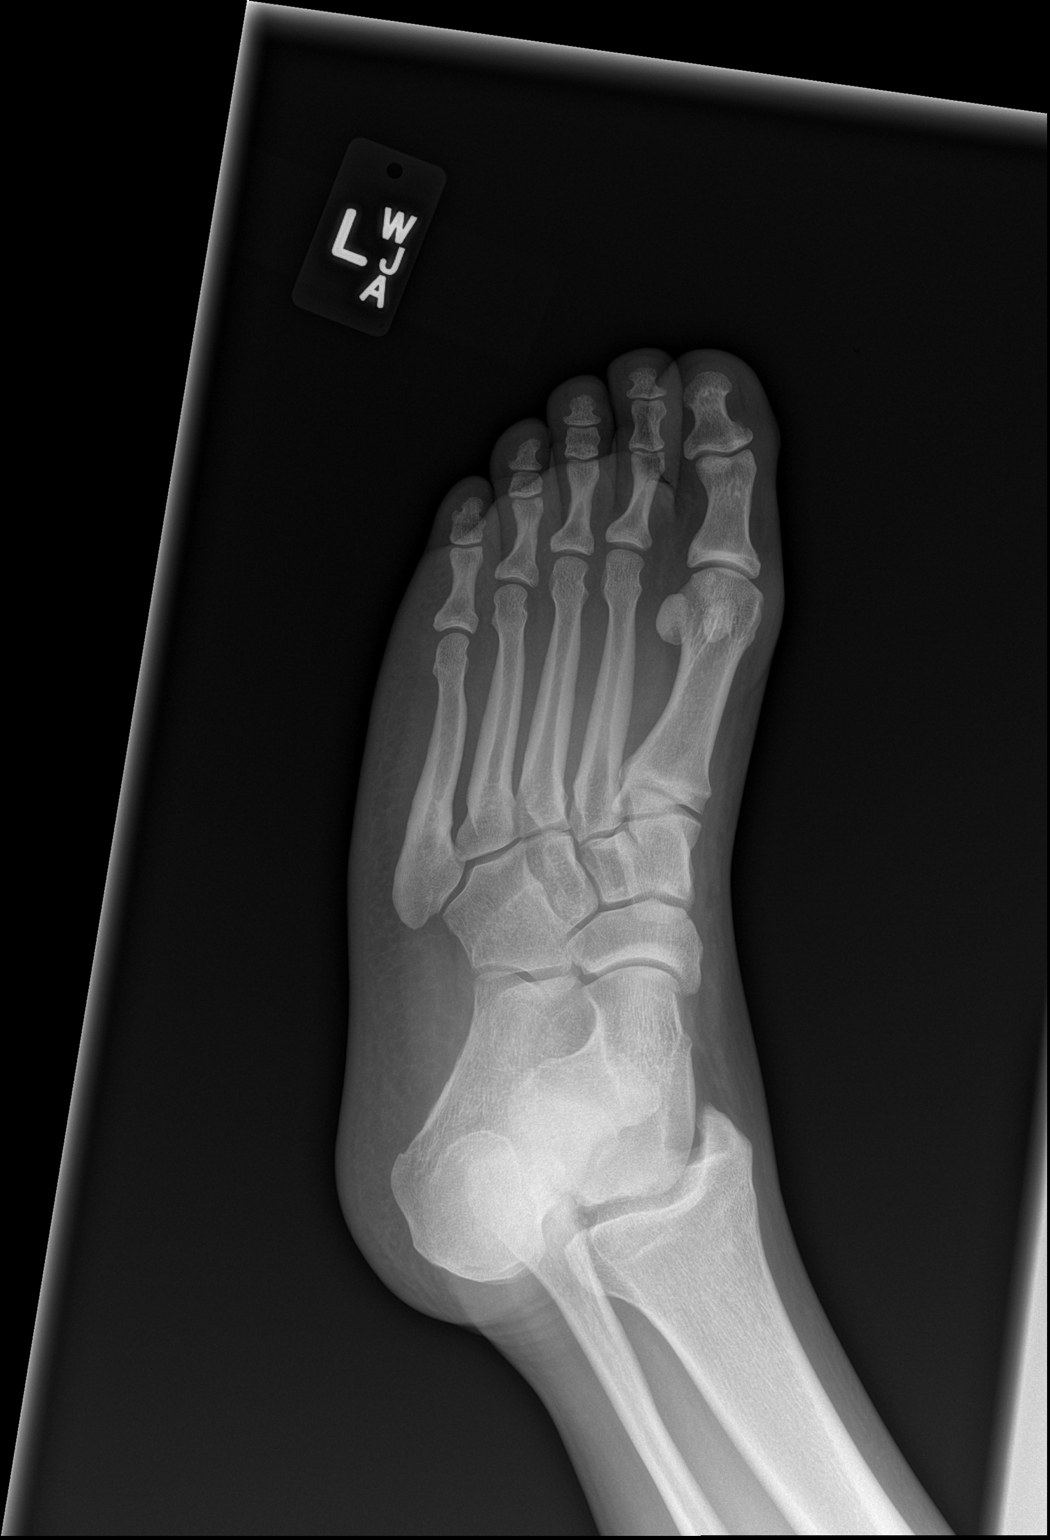

[x foot lat left]
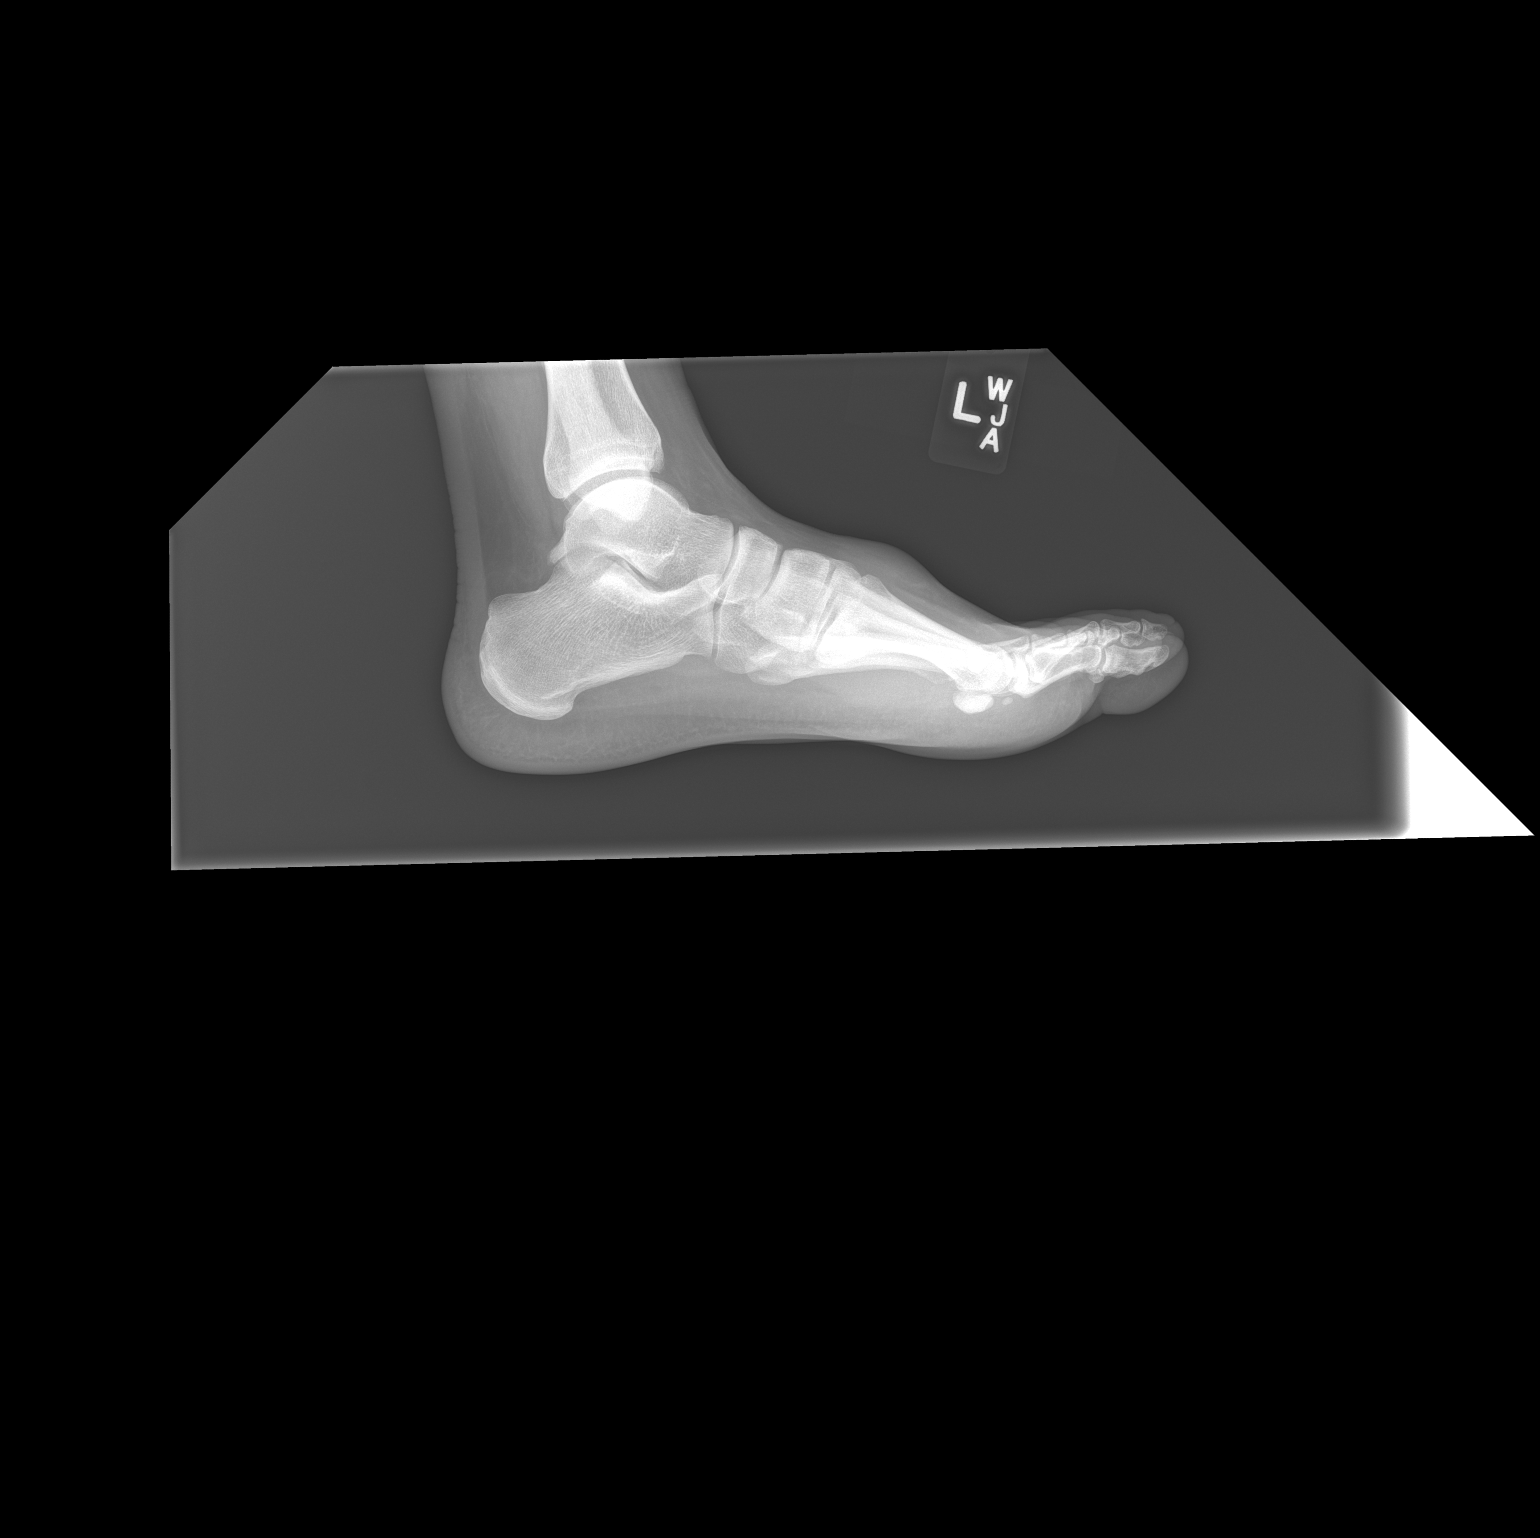

[3 of 3 positions shown; findings below may reference images not displayed]

FINDINGS: Focal soft tissue swelling is present of the level of the TMT
joints. There is no focal osseous abnormality. No radiopaque foreign
body is evident. The bones the foot are within normal limits.
IMPRESSION: Soft tissue swelling over the dorsum of the foot compatible with
focal cellulitis or abscess. No discrete osseous abnormality is
evident.

## 2020-04-09 ENCOUNTER — Encounter (HOSPITAL_COMMUNITY): Payer: Self-pay

## 2020-04-09 ENCOUNTER — Emergency Department (HOSPITAL_COMMUNITY)
Admission: EM | Admit: 2020-04-09 | Discharge: 2020-04-09 | Disposition: A | Discharge status: Home / Self Care | Payer: Self-pay | Attending: Emergency Medicine | Admitting: Emergency Medicine

## 2020-04-09 ENCOUNTER — Other Ambulatory Visit: Payer: Self-pay

## 2020-04-09 DIAGNOSIS — Y929 Unspecified place or not applicable: Secondary | ICD-10-CM | POA: Insufficient documentation

## 2020-04-09 DIAGNOSIS — Y939 Activity, unspecified: Secondary | ICD-10-CM | POA: Insufficient documentation

## 2020-04-09 DIAGNOSIS — Y999 Unspecified external cause status: Secondary | ICD-10-CM | POA: Insufficient documentation

## 2020-04-09 DIAGNOSIS — W208XXA Other cause of strike by thrown, projected or falling object, initial encounter: Secondary | ICD-10-CM | POA: Insufficient documentation

## 2020-04-09 DIAGNOSIS — F17210 Nicotine dependence, cigarettes, uncomplicated: Secondary | ICD-10-CM | POA: Insufficient documentation

## 2020-04-09 DIAGNOSIS — H53149 Visual discomfort, unspecified: Secondary | ICD-10-CM | POA: Insufficient documentation

## 2020-04-09 DIAGNOSIS — S0591XA Unspecified injury of right eye and orbit, initial encounter: Secondary | ICD-10-CM | POA: Insufficient documentation

## 2020-04-09 MED ORDER — TETRACAINE HCL 0.5 % OP SOLN
2.0000 [drp] | Freq: Once | OPHTHALMIC | Status: AC
  Administered 2020-04-09: 2 [drp] via OPHTHALMIC
  Filled 2020-04-09: qty 4

## 2020-04-09 MED ORDER — FLUORESCEIN SODIUM 1 MG OP STRP
1.0000 | ORAL_STRIP | Freq: Once | OPHTHALMIC | Status: AC
  Administered 2020-04-09: 1 via OPHTHALMIC
  Filled 2020-04-09: qty 1
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
## 2020-04-09 NOTE — Discharge Instructions (Signed)
Please go straight to Dr. Ovidio Kin office you have an appointment at 2:30 TODAY

## 2020-04-09 NOTE — ED Notes (Signed)
Pt near vision blurry in Rt eye.  Pt unable to complete  Far visual acuity screening d/t pain in R eye.

## 2022-06-16 ENCOUNTER — Other Ambulatory Visit: Payer: Self-pay

## 2022-06-16 ENCOUNTER — Ambulatory Visit (INDEPENDENT_AMBULATORY_CARE_PROVIDER_SITE_OTHER): Payer: BC Managed Care – PPO | Admitting: Rehabilitative and Restorative Service Providers"

## 2022-06-16 ENCOUNTER — Encounter: Payer: Self-pay | Admitting: Rehabilitative and Restorative Service Providers"

## 2022-06-16 DIAGNOSIS — R202 Paresthesia of skin: Secondary | ICD-10-CM | POA: Diagnosis not present

## 2022-06-16 DIAGNOSIS — M25522 Pain in left elbow: Secondary | ICD-10-CM

## 2022-06-16 DIAGNOSIS — M6281 Muscle weakness (generalized): Secondary | ICD-10-CM | POA: Diagnosis not present

## 2022-06-16 NOTE — Therapy (Addendum)
OUTPATIENT OCCUPATIONAL THERAPY ORTHO EVALUATION & DISCHARGE  Patient Name: Lee Pierce MRN: [id number] DOB:12-May-1981, 41 y.o., male Today's Date: 06/16/2022  PCP: Filomena Jungling, NP REFERRING PROVIDER: Filomena Jungling, NP   OT End of Session - 06/16/22 1418     Visit Number 1    Number of Visits 12    Date for OT Re-Evaluation 07/25/22    Authorization Type [employer] 30 combined visits / year    OT Start Time 1420    OT Stop Time 1523    OT Time Calculation (min) 63 min    Activity Tolerance Patient tolerated treatment well;Patient limited by fatigue;Patient limited by pain    Behavior During Therapy [facility] for tasks assessed/performed             History reviewed. No pertinent past medical history. History reviewed. No pertinent surgical history. There are no problems to display for this patient.   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 He states sometimes right hand goes numb with use and at night and her also has pain in left elbow with strong gripping and holding handles, etc. for about 1 month or so.   PRECAUTIONS: None  WEIGHT BEARING RESTRICTIONS No  PAIN:  Are you having pain? Yes Rating: 1/10 at rest now, up to 6-7/10 in last week   FALLS: Has patient fallen in last 6 months? No  LIVING ENVIRONMENT: Lives with: lives with their family   PLOF: Independent  PATIENT GOALS   OBJECTIVE:   HAND DOMINANCE: Right   ADLs: Overall ADLs: States no BADL  issues, but pain and some difficulties with IADL work tasks, lifting heavy objects, carrying multiple groceries, heavy trash, pushing lawn mower, and sleeping at night.    FUNCTIONAL OUTCOME MEASURES: Eval: Patient Specific Functional Scale: 5.8 (push roll bar, carry groceries, pushing lawnmower)  UPPER EXTREMITY ROM    Eval: makes full, tight fist b/l with full opposition.  Active ROM Right eval Left eval  Elbow flexion  147*  Elbow extension  0*  Wrist flexion 80 54*  Wrist extension 68 47*  Wrist ulnar deviation    Wrist radial deviation    Wrist pronation    Wrist supination    (Blank rows = not tested)   UPPER EXTREMITY MMT:     MMT Right eval Left eval  Elbow flexion 5/5 5/5  Elbow extension 5/5 4/5  Wrist flexion 5/5 4+/5  Wrist extension 5/5 4-/5 pain  Wrist ulnar deviation    Wrist radial deviation    Wrist pronation  4+/5  Wrist supination  4/5  (Blank rows = not tested)  HAND FUNCTION: Eval: Grip strength: Right: 77# lbs, 76, 73, 77, 58 (starts to get numb after repeated trials)  Left: 36# painful L elbow lbs   COORDINATION: Eval: coordination seems grossly intact b/l   SENSATION: Eval:  Light touch intact today, though diminished in right hand digits 3, 4 after repetitive gripping  EDEMA:   Eval: none significant today  OBSERVATIONS:   Eval: Right side: Positive Scratch & Collapse Test to right carpal tunnel (repeatedly) and no where else. Neg. Tinnel's at wrist or elbow and neg wrist flexion and elbow flexion tests. Also numbness exacerbated by strong repetitive grip today.  Seems like CTS caused by lumbrical pressure and/or position.   Left side: tenderness to later epicondyle and extensor wad, positive resisted wrist ext trest, seems like tennis elbow.    TODAY'S TREATMENT:  Eval: He seems to be having 2 main issues- R median nerve compression at right carpal tunnel & tennis elbow in left forearm.   He was edu thoroughly on avoidance of nerve  compression in right hand (postures, habits, routines, vibration, etc.) and compensating with avoiding strong/prolonged grip and wearing anti-shock/vibration gloves when mowing, etc.  Night positions were educated on as well.  For Left elbow, OT edu on avoiding provacative lifting positions (straight elbow, pronated, wrist extended) and edu and measured for a counter-force brace and asked to privately purchase or have MD write a script for one to use insurance coverage. This elbow/counter-force brace will take pressure off healing ECRB tendon to lateral epicondyle, and he was instructed how to put on and check for proper fit.  He was also edu in through home exercise program as below, which her tolerated well and did not cause exacerbation (addressing both sides):   Exercises - Tricep Stretch- DO SEATED BY TABLE  - 3-4 x daily - 3-5 reps - 15 hold - Wrist Pronation Stretch  - 4-6 x daily - 1 sets - 10-15 reps - Forearm Supination Stretch  - 3-4 x daily - 3-5 reps - 15 sec hold - Seated Wrist Flexion with Overpressure  - 3-4 x daily - 3-5 reps - 15 sec hold - Wrist Prayer Stretch  - 3-4 x daily - 3-5 reps - 15 sec hold - Seated Median Nerve Glide  - 3-4 x daily - 5 reps - Seated Single Digit Intrinsic Stretch  - 3-4 x daily - 3-5 reps - 15-20 sec hold - Seated Claw Fist with Putty  - 2-3 x daily - 5-10 reps - 5 sec hold   PATIENT EDUCATION: Education details: See tx section above for details  Person educated: Patient Education method: Verbal Instruction, Teach back, Handouts  Education comprehension: States and demonstrates understanding, Additional Education required    HOME EXERCISE PROGRAM: Access Code: [email or web address] URL: [email or web address] Date: 06/16/2022 Prepared by: Fannie Knee  GOALS: Goals reviewed with patient? Yes   SHORT TERM GOALS: (STG required if POC>30 days)  Pt will obtain protective, custom orthotic. Target date: TBD, PRN Goal status: Initial  2.   Pt will demo/state understanding of initial HEP to improve pain levels and prerequisite motion. Target date: 06/27/22 Goal status: INITIAL   LONG TERM GOALS:  Pt will improve functional ability by decreased impairment per PSFS assessment from 5.8 to 8 or better, for better quality of life. Target date: 07/25/22 Goal status: INITIAL  2.  Pt will improve grip strength in left hand from 36lbs to at least 50lbs for functional use at home and in IADLs. Target date: 07/25/22 Goal status: INITIAL  3.  Pt will improve A/ROM in left wrist flex / ext  from 54/47 to at least 65* each, to have functional motion for tasks like reach and grasp.  Target date: 07/25/22 Goal status: INITIAL  4.  Pt will improve strength in left wrist ext from 4-/5 MMT to at least 4+/5 MMT to have increased functional ability  to carry out selfcare and higher-level homecare tasks with no difficulty. Target date: 07/25/33 Goal status: INITIAL  5.  Pt will decrease numbness rating from "moderate" with activities lasting > to "mild" or better to have better sleep and occupational participation in daily roles. Target date: 07/25/22 Goal status: INITIAL    ASSESSMENT:  CLINICAL IMPRESSION: Patient is a 41 y.o. male who was seen today for occupational therapy evaluation for R median nerve compression at carpal tunnel & tennis elbow in left forearm. These tings negatively impact his occupational performance and engagement, and OT will be of help to him.  Unfortunately he states unable to get time off work, typically so showing up for therapy may be a barrier to recovery.   PERFORMANCE DEFICITS in functional skills including IADLs, sensation, ROM, strength, pain, fascial restrictions, flexibility, GMC, body mechanics, endurance, decreased knowledge of precautions, and UE functional use, cognitive skills including problem solving and safety awareness, and psychosocial skills including coping strategies, environmental  adaptation, habits, and routines and behaviors.   IMPAIRMENTS are limiting patient from IADLs, rest and sleep, work, play, and leisure.   COMORBIDITIES may have co-morbidities  that affects occupational performance. Patient will benefit from skilled OT to address above impairments and improve overall function.  MODIFICATION OR ASSISTANCE TO COMPLETE EVALUATION: Min-Moderate modification of tasks or assist with assess necessary to complete an evaluation.  OT OCCUPATIONAL PROFILE AND HISTORY: Problem focused assessment: Including review of records relating to presenting problem.  CLINICAL DECISION MAKING: Moderate - several treatment options, min-mod task modification necessary  REHAB POTENTIAL: Good  EVALUATION COMPLEXITY: Low      PLAN: OT FREQUENCY: 1-2x/week  OT DURATION: 6 weeks (through 07/25/22)   PLANNED INTERVENTIONS: self care/ADL training, therapeutic exercise, therapeutic activity, neuromuscular re-education, manual therapy, passive range of motion, splinting, electrical stimulation, ultrasound, fluidotherapy, compression bandaging, moist heat, cryotherapy, contrast bath, patient/family education, coping strategies training, DME and/or AE instructions, and Re-evaluation  RECOMMENDED OTHER SERVICES: none now   CONSULTED AND AGREED WITH PLAN OF CARE: Patient  PLAN FOR NEXT SESSION: Ensure he's gotten elbow brace and knows/understands and performs HEP and follows recommendations.  Advance to PRE when pain is consistently low and well tolerated.    Fannie Knee, OTR/L, CHT 06/16/2022, 4:37 PM    OCCUPATIONAL THERAPY DISCHARGE SUMMARY  Visits from Start of Care: 1  OT called him as he hasn't been able to return to therapy in over a month.  He answered and stated doing much better now, wearing brace, doing HEP 4 x day, less pain and problems now.    Current functional level related to goals / functional outcomes: Goals could not be addressed over the phone, but he  states managing symptoms well and not needing any more therapy right now.   Remaining deficits: Pt has no more significant functional deficits or pain.   Education / Equipment: Pt has all needed materials and education. Pt understands how to continue on with self-management. See tx notes for more details.   Patient agrees to discharge due to max benefits received from outpatient occupational therapy / hand therapy at this time.   Fannie Knee, OTR/L, CHT 07/28/22

## 2023-03-12 ENCOUNTER — Ambulatory Visit
Admission: EM | Admit: 2023-03-12 | Discharge: 2023-03-12 | Disposition: A | Discharge status: Home / Self Care | Payer: BC Managed Care – PPO | Attending: Family Medicine | Admitting: Family Medicine

## 2023-03-12 DIAGNOSIS — K047 Periapical abscess without sinus: Secondary | ICD-10-CM

## 2023-03-12 DIAGNOSIS — K029 Dental caries, unspecified: Secondary | ICD-10-CM

## 2023-03-12 DIAGNOSIS — S46912A Strain of unspecified muscle, fascia and tendon at shoulder and upper arm level, left arm, initial encounter: Secondary | ICD-10-CM

## 2023-03-12 MED ORDER — KETOROLAC TROMETHAMINE 30 MG/ML IJ SOLN
30.0000 mg | Freq: Once | INTRAMUSCULAR | Status: AC
  Administered 2023-03-12: 30 mg via INTRAMUSCULAR

## 2023-03-12 MED ORDER — METHOCARBAMOL 500 MG PO TABS: 500.0000 mg | ORAL_TABLET | Freq: Two times a day (BID) | ORAL | 0 refills | Status: AC

## 2023-03-12 MED ORDER — AMOXICILLIN-POT CLAVULANATE 875-125 MG PO TABS: 1.0000 | ORAL_TABLET | Freq: Two times a day (BID) | ORAL | 0 refills | Status: AC

## 2023-03-12 MED ORDER — PREDNISONE 20 MG PO TABS: 40.0000 mg | ORAL_TABLET | Freq: Every day | ORAL | 0 refills | Status: AC

## 2023-03-12 NOTE — ED Provider Notes (Signed)
[facility]    CSN: [id number] Arrival date & time: 03/12/23  0850      History   Chief Complaint No chief complaint on file.   HPI Lee Pierce is a 42 y.o. male.   HPI Lee Pierce 42 year old male presents today for evaluation of left shoulder pain.  Patient reports over 5 years ago he dislocated his shoulder and was able to reposition the injury back into place and never followed up with a orthopedic provider.  He reports no issues over the last 5 years up until a few days ago he began having intermittent localized pain at the [facility] joint of the left shoulder.  He reports occasionally the pain radiates into his back and upper chest while.  He has been taking ibuprofen with temporary relief of pain.  He denies any known injury.  Also complains of tooth pain on the right side of his mouth.  Patient to have a tooth pulled next Friday as his molar tooth is "rotten". He reports that his current pain is continuous.  He has no facial swelling.  History reviewed. No pertinent past medical history.      Home Medications    Prior to Admission medications   Medication Sig Start Date End Date Taking? Authorizing Provider  amoxicillin-clavulanate (AUGMENTIN) 875-125 MG tablet Take 1 tablet by mouth every 12 (twelve) hours. 03/12/23  Yes Bing Neighbors, NP  methocarbamol (ROBAXIN) 500 MG tablet Take 1 tablet (500 mg total) by mouth 2 (two) times daily. 03/12/23  Yes Bing Neighbors, NP  predniSONE (DELTASONE) 20 MG tablet Take 2 tablets (40 mg total) by mouth daily with breakfast. 03/12/23  Yes Bing Neighbors, NP    Family History History reviewed. No pertinent family history.  Social History Social History   Tobacco Use   Smoking status: Every Day    Packs/day: .5    Types: Cigarettes  Substance Use Topics   Alcohol use: Yes   Drug use: No     Allergies   Patient has no known allergies.   Review of Systems Review of Systems Pertinent negatives listed in HPI    Physical Exam Triage Vital Signs ED Triage Vitals  Enc Vitals Group     BP 03/12/23 0906 (!) 142/111     Pulse Rate 03/12/23 0906 94     Resp 03/12/23 0906 15     Temp 03/12/23 0906 98.3 F (36.8 C)     Temp Source 03/12/23 0906 Oral     SpO2 03/12/23 0906 96 %     Weight --      Height --      Head Circumference --      Peak Flow --      Pain Score 03/12/23 0910 7     Pain Loc --      Pain Edu? --      Excl. in GC? --      Updated Vital Signs BP (!) 142/111 (BP Location: Right Arm)   Pulse 94   Temp 98.3 F (36.8 C) (Oral)   Resp 15   SpO2 96%   Visual Acuity Right Eye Distance:   Left Eye Distance:   Bilateral Distance:    Right Eye Near:   Left Eye Near:    Bilateral Near:     Physical Exam Vitals reviewed.  Constitutional:      Appearance: Normal appearance.  HENT:     Head: Normocephalic and atraumatic.     Mouth/Throat:  Mouth: Mucous membranes are moist.     Dentition: Gingival swelling and dental caries present.  Eyes:     Extraocular Movements: Extraocular movements intact.  Cardiovascular:     Rate and Rhythm: Normal rate.  Pulmonary:     Effort: Pulmonary effort is normal.  Musculoskeletal:     Left shoulder: Swelling, tenderness and bony tenderness present. Normal strength.     Cervical back: Normal range of motion. No rigidity.  Skin:    General: Skin is warm.  Neurological:     General: No focal deficit present.     Mental Status: He is alert.      UC Treatments / Results  Labs (all labs ordered are listed, but only abnormal results are displayed) Labs Reviewed - No data to display  EKG   Radiology No results found.  Procedures Procedures (including critical care time)  Medications Ordered in UC Medications  ketorolac (TORADOL) 30 MG/ML injection 30 mg (30 mg Intramuscular Given 03/12/23 0924)    Initial Impression / Assessment and Plan / UC Course  I have reviewed the triage vital signs and the nursing  notes.  Pertinent labs & imaging results that were available during my care of the patient were reviewed by me and considered in my medical decision making (see chart for details).    Suspect left shoulder strain, patient maintains full active and passive range of motion therefore deferring imaging as is not available onsite today patient has had no recent injury.  Has trialed and failed ibuprofen for several days therefore we will trial prednisone 40 mg once daily for the next 5 days.  Patient also received Toradol 30 mg injection, for acute pain and inflammation.  Robaxin as needed for shoulder pain. Patient also has a dental caries , which  appears to be infected and inflamed-prescribed Augmentin twice daily for 7 days as patient has an appointment in 1 week to have the tooth extracted.  Advised to follow-up with sports medicine provider if symptoms worsen or do not improve.   Final Clinical Impressions(s) / UC Diagnoses   Final diagnoses:  Left shoulder strain, initial encounter  Infected dental caries     Discharge Instructions      Start prednisone 40 mg daily and take for a total of 5 days. For moderate to severe pain, take Robaxin 750 mg up to twice daily-avoid taking with driving or performing activities that require alertness as medication can cause sedation and or drowsiness. You received a Toradol injection for inflammation and pain. Do not take any Ibuprofen or Naproxen while taking prednisone. Tylenol can be taken with prednisone.       ED Prescriptions     Medication Sig Dispense Auth. Provider   amoxicillin-clavulanate (AUGMENTIN) 875-125 MG tablet Take 1 tablet by mouth every 12 (twelve) hours. 14 tablet Bing Neighbors, NP   methocarbamol (ROBAXIN) 500 MG tablet Take 1 tablet (500 mg total) by mouth 2 (two) times daily. 20 tablet Bing Neighbors, NP   predniSONE (DELTASONE) 20 MG tablet Take 2 tablets (40 mg total) by mouth daily with breakfast. 10 tablet  Bing Neighbors, NP      PDMP not reviewed this encounter.   Bing Neighbors, NP 03/12/23 [phone]

## 2023-03-12 NOTE — ED Triage Notes (Signed)
Pt c/o left shoulder pain x pt states he has had a left shoulder dislocation x 5 years ago, but now has noticed a knot and it has become more painful in the last few days. Pt is aware we do not have X ray today.

## 2023-03-12 NOTE — Discharge Instructions (Addendum)
Start prednisone 40 mg daily and take for a total of 5 days. For moderate to severe pain, take Robaxin 750 mg up to twice daily-avoid taking with driving or performing activities that require alertness as medication can cause sedation and or drowsiness. You received a Toradol injection for inflammation and pain. Do not take any Ibuprofen or Naproxen while taking prednisone. Tylenol can be taken with prednisone.

## 2023-04-21 ENCOUNTER — Ambulatory Visit (HOSPITAL_BASED_OUTPATIENT_CLINIC_OR_DEPARTMENT_OTHER)
Admission: RE | Admit: 2023-04-21 | Discharge: 2023-04-21 | Disposition: A | Discharge status: Home / Self Care | Payer: Self-pay | Source: Ambulatory Visit | Attending: Urgent Care | Admitting: Urgent Care

## 2023-04-21 ENCOUNTER — Ambulatory Visit: Payer: Self-pay

## 2023-04-21 ENCOUNTER — Ambulatory Visit
Admission: EM | Admit: 2023-04-21 | Discharge: 2023-04-21 | Disposition: A | Discharge status: Home / Self Care | Payer: Self-pay | Attending: Urgent Care | Admitting: Urgent Care

## 2023-04-21 DIAGNOSIS — R079 Chest pain, unspecified: Secondary | ICD-10-CM | POA: Diagnosis not present

## 2023-04-21 DIAGNOSIS — M25562 Pain in left knee: Secondary | ICD-10-CM | POA: Diagnosis not present

## 2023-04-21 DIAGNOSIS — M25522 Pain in left elbow: Secondary | ICD-10-CM | POA: Diagnosis not present

## 2023-04-21 DIAGNOSIS — M25532 Pain in left wrist: Secondary | ICD-10-CM | POA: Insufficient documentation

## 2023-04-21 DIAGNOSIS — M2550 Pain in unspecified joint: Secondary | ICD-10-CM | POA: Insufficient documentation

## 2023-04-21 DIAGNOSIS — M79642 Pain in left hand: Secondary | ICD-10-CM | POA: Diagnosis not present

## 2023-04-21 DIAGNOSIS — F17210 Nicotine dependence, cigarettes, uncomplicated: Secondary | ICD-10-CM | POA: Diagnosis not present

## 2023-04-21 MED ORDER — NAPROXEN 500 MG PO TABS: 500.0000 mg | ORAL_TABLET | Freq: Two times a day (BID) | ORAL | 0 refills | Status: AC

## 2023-04-21 MED ORDER — TIZANIDINE HCL 4 MG PO TABS: 4.0000 mg | ORAL_TABLET | Freq: Every day | ORAL | 0 refills | Status: AC

## 2023-04-21 NOTE — Discharge Instructions (Signed)
I have placed orders to have an x-ray done at the med center in [place].  Please had there now.  Go through the main hospital and not the emergency room.  Once you are there and let them know that you will came to our clinic and we send she to their facility for an outpatient x-ray.  If no one is at the front desk then they are likely out the rest of the day and at that point you would have to go through the emergency room.  Do not check in as a patient through the emergency room.  Simply let them know that you are there for an outpatient x-ray from our clinic.  I will call you with your results and update our treatment plan if necessary after I get the report.  Please wait to go pick up your prescriptions for any medications I have prescribed for you until after we discussed your x-ray results.  
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
